# Patient Record
Sex: Male | Born: 1990 | Race: White | Hispanic: No | Marital: Single | Smoking: Current every day smoker
Health system: Southern US, Community
[De-identification: ages and names within clinical notes are randomized; demographics above are authoritative.]

## PROBLEM LIST (undated history)

## (undated) DIAGNOSIS — Z765 Malingerer [conscious simulation]: Secondary | ICD-10-CM

## (undated) DIAGNOSIS — N200 Calculus of kidney: Secondary | ICD-10-CM

## (undated) DIAGNOSIS — N179 Acute kidney failure, unspecified: Secondary | ICD-10-CM

## (undated) HISTORY — PX: FINGER SURGERY: SHX640

---

## 2014-05-26 ENCOUNTER — Emergency Department: Payer: Self-pay | Admitting: Emergency Medicine

## 2015-01-18 DIAGNOSIS — Z720 Tobacco use: Secondary | ICD-10-CM | POA: Diagnosis present

## 2015-01-18 DIAGNOSIS — D72829 Elevated white blood cell count, unspecified: Secondary | ICD-10-CM | POA: Insufficient documentation

## 2015-01-18 DIAGNOSIS — E860 Dehydration: Secondary | ICD-10-CM | POA: Insufficient documentation

## 2015-01-18 DIAGNOSIS — F1290 Cannabis use, unspecified, uncomplicated: Secondary | ICD-10-CM | POA: Insufficient documentation

## 2018-01-07 DIAGNOSIS — S62524D Nondisplaced fracture of distal phalanx of right thumb, subsequent encounter for fracture with routine healing: Secondary | ICD-10-CM | POA: Insufficient documentation

## 2018-01-15 DIAGNOSIS — L02511 Cutaneous abscess of right hand: Secondary | ICD-10-CM | POA: Insufficient documentation

## 2018-01-16 DIAGNOSIS — L03011 Cellulitis of right finger: Secondary | ICD-10-CM | POA: Insufficient documentation

## 2018-01-16 DIAGNOSIS — M6510 Other infective (teno)synovitis, unspecified site: Secondary | ICD-10-CM | POA: Insufficient documentation

## 2019-05-08 ENCOUNTER — Emergency Department (HOSPITAL_BASED_OUTPATIENT_CLINIC_OR_DEPARTMENT_OTHER): Admission: EM | Admit: 2019-05-08 | Discharge: 2019-05-08 | Discharge status: Against Medical Advice | Payer: Self-pay

## 2019-05-08 ENCOUNTER — Other Ambulatory Visit: Payer: Self-pay

## 2019-06-03 DIAGNOSIS — B159 Hepatitis A without hepatic coma: Secondary | ICD-10-CM

## 2019-06-03 HISTORY — DX: Hepatitis a without hepatic coma: B15.9

## 2020-09-03 DIAGNOSIS — Z765 Malingerer [conscious simulation]: Secondary | ICD-10-CM | POA: Insufficient documentation

## 2020-09-21 ENCOUNTER — Emergency Department (HOSPITAL_BASED_OUTPATIENT_CLINIC_OR_DEPARTMENT_OTHER)
Admission: EM | Admit: 2020-09-21 | Discharge: 2020-09-21 | Disposition: A | Discharge status: Home / Self Care | Payer: Self-pay | Attending: Emergency Medicine | Admitting: Emergency Medicine

## 2020-09-21 ENCOUNTER — Emergency Department (HOSPITAL_BASED_OUTPATIENT_CLINIC_OR_DEPARTMENT_OTHER): Payer: Self-pay

## 2020-09-21 ENCOUNTER — Encounter (HOSPITAL_BASED_OUTPATIENT_CLINIC_OR_DEPARTMENT_OTHER): Payer: Self-pay

## 2020-09-21 ENCOUNTER — Other Ambulatory Visit: Payer: Self-pay

## 2020-09-21 DIAGNOSIS — F17210 Nicotine dependence, cigarettes, uncomplicated: Secondary | ICD-10-CM | POA: Insufficient documentation

## 2020-09-21 DIAGNOSIS — R319 Hematuria, unspecified: Secondary | ICD-10-CM | POA: Insufficient documentation

## 2020-09-21 DIAGNOSIS — R109 Unspecified abdominal pain: Secondary | ICD-10-CM | POA: Insufficient documentation

## 2020-09-21 HISTORY — DX: Acute kidney failure, unspecified: N17.9

## 2020-09-21 HISTORY — DX: Calculus of kidney: N20.0

## 2020-09-21 LAB — URINALYSIS, ROUTINE W REFLEX MICROSCOPIC
Bilirubin Urine: NEGATIVE
Glucose, UA: NEGATIVE mg/dL
Ketones, ur: NEGATIVE mg/dL
Nitrite: NEGATIVE
Protein, ur: NEGATIVE mg/dL
Specific Gravity, Urine: 1.02 (ref 1.005–1.030)
pH: 6.5 (ref 5.0–8.0)

## 2020-09-21 LAB — URINALYSIS, MICROSCOPIC (REFLEX): RBC / HPF: >50 RBC/hpf (ref 0–5)

## 2020-09-21 MED ORDER — ACETAMINOPHEN 325 MG PO TABS
650.0000 mg | ORAL_TABLET | Freq: Once | ORAL | Status: AC
  Administered 2020-09-21: 650 mg via ORAL
  Filled 2020-09-21: qty 2

## 2020-09-21 MED ORDER — CIPROFLOXACIN HCL 500 MG PO TABS: 500.0000 mg | ORAL_TABLET | Freq: Two times a day (BID) | ORAL | 0 refills | Status: AC

## 2020-09-21 NOTE — Discharge Instructions (Addendum)
Follow up with Urology

## 2020-09-21 NOTE — ED Notes (Signed)
Pt urinated into strainer, small blood clots noted

## 2020-09-21 NOTE — ED Triage Notes (Addendum)
Pt c/o hematuria started yesterday-NAD-steady gait

## 2020-09-21 NOTE — ED Provider Notes (Signed)
[facility] EMERGENCY DEPARTMENT Provider Note   CSN: [id number] Arrival date & time: 09/21/20  1130     History Chief Complaint  Patient presents with  . Hematuria  . Abdominal Pain    Charles Fritz is a 30 y.o. male.  The history is provided by the patient. No language interpreter was used.  Hematuria This is a new problem. The current episode started 6 to 12 hours ago. The problem occurs constantly. The problem has been gradually worsening. Associated symptoms include abdominal pain. Nothing aggravates the symptoms. Nothing relieves the symptoms. He has tried nothing for the symptoms. The treatment provided no relief.  Abdominal Pain Associated symptoms: hematuria        Past Medical History:  Diagnosis Date  . Kidney failure, acute (HCC)   . Kidney stone     There are no problems to display for this patient.        No family history on file.  Social History   Tobacco Use  . Smoking status: Current Every Day Smoker    Types: Cigarettes  . Smokeless tobacco: Never Used  Substance Use Topics  . Alcohol use: Not Currently  . Drug use: Yes    Types: Marijuana    Home Medications Prior to Admission medications   Medication Sig Start Date End Date Taking? Authorizing Provider  ciprofloxacin (CIPRO) 500 MG tablet Take 1 tablet (500 mg total) by mouth 2 (two) times daily for 7 days. 09/21/20 09/28/20 Yes Elson Areas, PA-C    Allergies    Hydrocodone  Review of Systems   Review of Systems  Gastrointestinal: Positive for abdominal pain.  Genitourinary: Positive for hematuria.  All other systems reviewed and are negative.   Physical Exam Updated Vital Signs BP 130/90 (BP Location: Right Arm)   Pulse 74   Temp 98.3 F (36.8 C) (Oral)   Resp 16   Ht 5\' 11"  (1.803 m)   Wt 87.5 kg   SpO2 99%   BMI 26.92 kg/m   Physical Exam Vitals and nursing note reviewed.  Constitutional:      Appearance: He is well-developed.  HENT:      Head: Normocephalic and atraumatic.  Eyes:     Conjunctiva/sclera: Conjunctivae normal.  Cardiovascular:     Rate and Rhythm: Normal rate and regular rhythm.     Heart sounds: No murmur heard.   Pulmonary:     Effort: Pulmonary effort is normal. No respiratory distress.     Breath sounds: Normal breath sounds.  Abdominal:     General: Bowel sounds are normal.     Palpations: Abdomen is soft.     Tenderness: There is no abdominal tenderness.  Musculoskeletal:     Cervical back: Neck supple.  Skin:    General: Skin is warm and dry.  Neurological:     General: No focal deficit present.     Mental Status: He is alert.  Psychiatric:        Mood and Affect: Mood normal.     ED Results / Procedures / Treatments   Labs (all labs ordered are listed, but only abnormal results are displayed) Labs Reviewed  URINALYSIS, ROUTINE W REFLEX MICROSCOPIC - Abnormal; Notable for the following components:      Result Value   APPearance CLOUDY (*)    Hgb urine dipstick LARGE (*)    Leukocytes,Ua MODERATE (*)    All other components within normal limits  URINALYSIS, MICROSCOPIC (REFLEX) - Abnormal; Notable for the  following components:   Bacteria, UA MANY (*)    All other components within normal limits    EKG None  Radiology CT Renal Stone Study  Result Date: 09/21/2020 CLINICAL DATA:  Hematuria and flank/pelvic pain EXAM: CT ABDOMEN AND PELVIS WITHOUT CONTRAST TECHNIQUE: Multidetector CT imaging of the abdomen and pelvis was performed following the standard protocol without IV contrast. COMPARISON:  08/18/2020 CT scan FINDINGS: Lower chest: Unremarkable where included. Hepatobiliary: Unremarkable where included Pancreas: Unremarkable Spleen: Unremarkable Adrenals/Urinary Tract: Both adrenal glands appear normal. 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 No urinary tract calculi, hydronephrosis, or hydroureter. The urinary bladder appears normal. No substantial change from the  appearance from 5 weeks ago. Stomach/Bowel: Unremarkable.  Appendix normal. Vascular/Lymphatic: Unremarkable Reproductive: Unremarkable Other: No supplemental non-categorized findings. Musculoskeletal: Unremarkable IMPRESSION: 1. No significant abnormality is identified to account for the patient's symptoms. 2. 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 Electronically Signed   By: Gaylyn Rong M.D.   On: 09/21/2020 12:53    Procedures Procedures   Medications Ordered in ED Medications  acetaminophen (TYLENOL) tablet 650 mg (650 mg Oral Given 09/21/20 1309)    ED Course  I have reviewed the triage vital signs and the nursing notes.  Pertinent labs & imaging results that were available during my care of the patient were reviewed by me and considered in my medical decision making (see chart for details).    MDM Rules/Calculators/A&P                          MDM:  Ct scan is normal.  Ua shows may bacteria and greater than 50 rbc's.  Pt recently treat with keflex.  I will treat with cipro and refer  Final Clinical Impression(s) / ED Diagnoses Final diagnoses:  Hematuria, unspecified type    Rx / DC Orders ED Discharge Orders         Ordered    ciprofloxacin (CIPRO) 500 MG tablet  2 times daily        09/21/20 1322        An After Visit Summary was printed and given to the patient.    Elson Areas, [place] 09/21/20 1920    Little, Ambrose Finland, MD 09/23/20 [phone]

## 2021-01-10 DIAGNOSIS — N289 Disorder of kidney and ureter, unspecified: Secondary | ICD-10-CM | POA: Insufficient documentation

## 2021-01-24 ENCOUNTER — Emergency Department (HOSPITAL_COMMUNITY): Payer: Self-pay

## 2021-01-24 ENCOUNTER — Emergency Department (HOSPITAL_COMMUNITY)
Admission: EM | Admit: 2021-01-24 | Discharge: 2021-01-25 | Disposition: A | Discharge status: Home / Self Care | Payer: Self-pay | Attending: Physician Assistant | Admitting: Physician Assistant

## 2021-01-24 ENCOUNTER — Other Ambulatory Visit: Payer: Self-pay

## 2021-01-24 DIAGNOSIS — M791 Myalgia, unspecified site: Secondary | ICD-10-CM | POA: Insufficient documentation

## 2021-01-24 DIAGNOSIS — R0602 Shortness of breath: Secondary | ICD-10-CM | POA: Insufficient documentation

## 2021-01-24 DIAGNOSIS — R109 Unspecified abdominal pain: Secondary | ICD-10-CM | POA: Insufficient documentation

## 2021-01-24 DIAGNOSIS — R112 Nausea with vomiting, unspecified: Secondary | ICD-10-CM | POA: Insufficient documentation

## 2021-01-24 DIAGNOSIS — Z5321 Procedure and treatment not carried out due to patient leaving prior to being seen by health care provider: Secondary | ICD-10-CM | POA: Insufficient documentation

## 2021-01-24 LAB — CBC WITH DIFFERENTIAL/PLATELET
Abs Immature Granulocytes: 0.1 10*3/uL — ABNORMAL HIGH (ref 0.00–0.07)
Basophils Absolute: 0.1 10*3/uL (ref 0.0–0.1)
Basophils Relative: 1 %
Eosinophils Absolute: 0.3 10*3/uL (ref 0.0–0.5)
Eosinophils Relative: 2 %
HCT: 42.7 % (ref 39.0–52.0)
Hemoglobin: 14.3 g/dL (ref 13.0–17.0)
Immature Granulocytes: 1 %
Lymphocytes Relative: 23 %
Lymphs Abs: 3 10*3/uL (ref 0.7–4.0)
MCH: 31 pg (ref 26.0–34.0)
MCHC: 33.5 g/dL (ref 30.0–36.0)
MCV: 92.4 fL (ref 80.0–100.0)
Monocytes Absolute: 1.1 10*3/uL — ABNORMAL HIGH (ref 0.1–1.0)
Monocytes Relative: 9 %
Neutro Abs: 8.3 10*3/uL — ABNORMAL HIGH (ref 1.7–7.7)
Neutrophils Relative %: 64 %
Platelets: 245 10*3/uL (ref 150–400)
RBC: 4.62 MIL/uL (ref 4.22–5.81)
RDW: 12.7 % (ref 11.5–15.5)
WBC: 12.8 10*3/uL — ABNORMAL HIGH (ref 4.0–10.5)
nRBC: 0 % (ref 0.0–0.2)

## 2021-01-24 LAB — URINALYSIS, ROUTINE W REFLEX MICROSCOPIC
Bilirubin Urine: NEGATIVE
Glucose, UA: NEGATIVE mg/dL
Hgb urine dipstick: NEGATIVE
Ketones, ur: NEGATIVE mg/dL
Leukocytes,Ua: NEGATIVE
Nitrite: NEGATIVE
Protein, ur: NEGATIVE mg/dL
Specific Gravity, Urine: 1.026 (ref 1.005–1.030)
pH: 5 (ref 5.0–8.0)

## 2021-01-24 LAB — LIPASE, BLOOD: Lipase: 29 U/L (ref 11–51)

## 2021-01-24 LAB — COMPREHENSIVE METABOLIC PANEL
ALT: 29 U/L (ref 0–44)
AST: 26 U/L (ref 15–41)
Albumin: 4.6 g/dL (ref 3.5–5.0)
Alkaline Phosphatase: 75 U/L (ref 38–126)
Anion gap: 10 (ref 5–15)
BUN: 22 mg/dL — ABNORMAL HIGH (ref 6–20)
CO2: 22 mmol/L (ref 22–32)
Calcium: 9.7 mg/dL (ref 8.9–10.3)
Chloride: 105 mmol/L (ref 98–111)
Creatinine, Ser: 1.24 mg/dL (ref 0.61–1.24)
GFR, Estimated: >60 mL/min (ref >60)
Glucose, Bld: 99 mg/dL (ref 70–99)
Potassium: 4 mmol/L (ref 3.5–5.1)
Sodium: 137 mmol/L (ref 135–145)
Total Bilirubin: 0.8 mg/dL (ref 0.3–1.2)
Total Protein: 7.8 g/dL (ref 6.5–8.1)

## 2021-01-24 NOTE — ED Notes (Signed)
Pt did not want to wait said he was going to another hospital.

## 2021-01-24 NOTE — ED Triage Notes (Signed)
Pt c/o acute onset of abdominal cramping while at work today. Associated with NV, SOB, and generalized body cramping. Hospitalized in the past for acute renal failure, symptoms feel similar to previous episodes.

## 2021-01-24 NOTE — ED Provider Notes (Signed)
Emergency Medicine Provider Triage Evaluation Note  Charles Fritz , a 30 y.o. male  was evaluated in triage.  Pt complains of abd pain, nv, sob.  Review of Systems  Positive: Nv, abd pain, sob Negative: fever  Physical Exam  BP (!) 155/110 (BP Location: Left Arm)   Pulse (!) 108   Temp (!) 97.4 F (36.3 C) (Oral)   Resp (!) 24   SpO2 97%  Gen:   Awake, no distress   Resp:  Normal effort  MSK:   Moves extremities without difficulty   Medical Decision Making  Medically screening exam initiated at 10:03 PM.  Appropriate orders placed.  Charles Fritz was informed that the remainder of the evaluation will be completed by another provider, this initial triage assessment does not replace that evaluation, and the importance of remaining in the ED until their evaluation is complete.     Charles Fritz 01/24/21 2204    Charles Sprout, MD 01/24/21 2207

## 2021-04-22 ENCOUNTER — Emergency Department (HOSPITAL_BASED_OUTPATIENT_CLINIC_OR_DEPARTMENT_OTHER): Payer: Self-pay

## 2021-04-22 ENCOUNTER — Emergency Department (HOSPITAL_BASED_OUTPATIENT_CLINIC_OR_DEPARTMENT_OTHER)
Admission: EM | Admit: 2021-04-22 | Discharge: 2021-04-22 | Disposition: A | Discharge status: Home / Self Care | Payer: Self-pay | Attending: Emergency Medicine | Admitting: Emergency Medicine

## 2021-04-22 ENCOUNTER — Other Ambulatory Visit: Payer: Self-pay

## 2021-04-22 ENCOUNTER — Encounter (HOSPITAL_BASED_OUTPATIENT_CLINIC_OR_DEPARTMENT_OTHER): Payer: Self-pay

## 2021-04-22 DIAGNOSIS — R059 Cough, unspecified: Secondary | ICD-10-CM

## 2021-04-22 DIAGNOSIS — F1911 Other psychoactive substance abuse, in remission: Secondary | ICD-10-CM

## 2021-04-22 DIAGNOSIS — Z20822 Contact with and (suspected) exposure to covid-19: Secondary | ICD-10-CM | POA: Insufficient documentation

## 2021-04-22 DIAGNOSIS — J101 Influenza due to other identified influenza virus with other respiratory manifestations: Secondary | ICD-10-CM | POA: Insufficient documentation

## 2021-04-22 DIAGNOSIS — F17210 Nicotine dependence, cigarettes, uncomplicated: Secondary | ICD-10-CM | POA: Insufficient documentation

## 2021-04-22 HISTORY — DX: Other psychoactive substance abuse, in remission: F19.11

## 2021-04-22 LAB — RESP PANEL BY RT-PCR (FLU A&B, COVID) ARPGX2
Influenza A by PCR: POSITIVE — AB
Influenza B by PCR: NEGATIVE
SARS Coronavirus 2 by RT PCR: NEGATIVE

## 2021-04-22 LAB — D-DIMER, QUANTITATIVE: D-Dimer, Quant: <0.27 ug/mL-FEU (ref 0.00–0.50)

## 2021-04-22 MED ORDER — IPRATROPIUM-ALBUTEROL 0.5-2.5 (3) MG/3ML IN SOLN
3.0000 mL | Freq: Once | RESPIRATORY_TRACT | Status: AC
  Administered 2021-04-22: 3 mL via RESPIRATORY_TRACT
  Filled 2021-04-22: qty 3

## 2021-04-22 MED ORDER — DIPHENHYDRAMINE HCL 50 MG/ML IJ SOLN
50.0000 mg | Freq: Once | INTRAMUSCULAR | Status: AC
  Administered 2021-04-22: 50 mg via INTRAVENOUS
  Filled 2021-04-22: qty 1

## 2021-04-22 MED ORDER — METOCLOPRAMIDE HCL 5 MG/ML IJ SOLN
10.0000 mg | Freq: Once | INTRAMUSCULAR | Status: AC
  Administered 2021-04-22: 10 mg via INTRAVENOUS
  Filled 2021-04-22: qty 2

## 2021-04-22 MED ORDER — BENZONATATE 100 MG PO CAPS: 100.0000 mg | ORAL_CAPSULE | Freq: Three times a day (TID) | ORAL | 0 refills | Status: DC | PRN

## 2021-04-22 MED ORDER — ALBUTEROL SULFATE (2.5 MG/3ML) 0.083% IN NEBU
2.5000 mg | INHALATION_SOLUTION | Freq: Once | RESPIRATORY_TRACT | Status: AC
  Administered 2021-04-22: 2.5 mg via RESPIRATORY_TRACT
  Filled 2021-04-22: qty 3

## 2021-04-22 NOTE — ED Provider Notes (Signed)
[facility] EMERGENCY DEPARTMENT Provider Note   CSN: [id number] Arrival date & time: 04/22/21  0941     History Chief Complaint  Patient presents with   Cough    Charles Fritz is a 30 y.o. 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  Patient presents emergency department with a chief complaint of cough, headache, and chest congestion.  Patient states that his symptoms have been present over the last 2 weeks however worse over the last 3 days.  Patient states that his headache has been constant over the last 3 days.  Headache onset was gradual pain progressively worse over time.  At present patient rates pain 8/10 on the pain scale.  Pain is located to frontotemporal aspect of head.  Patient reports that pain is worse with cough.  Patient has not tried any modalities to alleviate his symptoms.  Denies any visual disturbance, numbness, weakness, facial asymmetry, dysarthria, neck pain, neck stiffness.  Patient reports that cough is producing clear mucus.  States that he has had shortness of breath over the last 2 days.  Denies any hemoptysis, chest pain, leg swelling, palpitations.  Patient denies any known sick contacts.  Patient denies vaccination for influenza or COVID-19.  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  Marijuana occasionally   Kidney failure, acute (HCC)    Kidney stone     There are no problems to display for this patient.   Past Surgical History:  Procedure Laterality Date   FINGER SURGERY         Family History   Problem Relation Age of Onset   Healthy Mother    Healthy Father    Healthy Sister    Healthy Brother     Social History   Tobacco Use   Smoking status: Every Day    Packs/day: 0.50    Years: 15.00    Pack years: 7.50    Types: Cigarettes   Smokeless tobacco: Never  Substance Use Topics   Alcohol use: Not Currently   Drug use: Yes    Frequency: 3.0 times per week    Types: Marijuana    Home Medications Prior to Admission medications   Not on File    Allergies    Hydrocodone  Review of Systems   Review of Systems  Constitutional:  Negative for chills and fever.  HENT:  Negative for congestion, rhinorrhea and sore throat.   Eyes:  Negative for visual disturbance.  Respiratory:  Positive for cough and shortness of breath.   Cardiovascular:  Negative for chest pain, palpitations and leg swelling.  Gastrointestinal:  Negative for abdominal pain, constipation, diarrhea, nausea and vomiting.  Genitourinary:  Negative for difficulty urinating and dysuria.  Musculoskeletal:  Negative for back pain, neck pain and neck stiffness.  Skin:  Negative for color change and rash.  Neurological:  Positive for headaches. Negative for dizziness, tremors, seizures, syncope, facial asymmetry, speech difficulty, weakness, light-headedness and numbness.  Psychiatric/Behavioral:  Negative for confusion.    Physical Exam  Updated Vital Signs BP (!) 145/105 (BP Location: Right Arm)   Pulse 85   Temp 98.1 F (36.7 C) (Oral)   Resp 17   Ht 5\' 11"  (1.803 m)   Wt 104.3 kg   SpO2 99%   BMI 32.08 kg/m   Physical Exam Vitals and nursing note reviewed.  Constitutional:      General: He is not in acute distress.    Appearance: He is ill-appearing. He is not toxic-appearing or diaphoretic.  HENT:     Head: Normocephalic.  Eyes:     General: No scleral icterus.       Right eye: No discharge.        Left eye: No discharge.     Extraocular Movements: Extraocular movements intact.      Conjunctiva/sclera: Conjunctivae normal.     Pupils: Pupils are equal, round, and reactive to light.  Cardiovascular:     Rate and Rhythm: Normal rate.  Pulmonary:     Effort: Pulmonary effort is normal. No tachypnea, bradypnea or respiratory distress.     Breath sounds: Normal breath sounds. No stridor.  Musculoskeletal:     Cervical back: Normal range of motion and neck supple. No edema, erythema, signs of trauma, rigidity, torticollis or crepitus. No pain with movement, spinous process tenderness or muscular tenderness. Normal range of motion.     Right lower leg: Normal.     Left lower leg: Normal.  Skin:    General: Skin is warm and dry.  Neurological:     General: No focal deficit present.     Mental Status: He is alert.     GCS: GCS eye subscore is 4. GCS verbal subscore is 5. GCS motor subscore is 6.     Cranial Nerves: Cranial nerves 2-12 are intact. No cranial nerve deficit, dysarthria or facial asymmetry.     Sensory: Sensation is intact.     Motor: No weakness, tremor, seizure activity or pronator drift.     Coordination: Finger-Nose-Finger Test normal.     Gait: Gait is intact. Gait normal.     Comments:  CN II through XII intact, +5 strength to bilateral upper and lower extremities, sensation to light touch intact to bilateral upper and lower extremities.  Patient able to stand and ambulate without difficulty.  Psychiatric:        Behavior: Behavior is cooperative.    ED Results / Procedures / Treatments   Labs (all labs ordered are listed, but only abnormal results are displayed) Labs Reviewed  RESP PANEL BY RT-PCR (FLU A&B, COVID) ARPGX2 - Abnormal; Notable for the following components:      Result Value   Influenza A by PCR POSITIVE (*)    All other components within normal limits  D-DIMER, QUANTITATIVE    EKG None  Radiology DG Chest Port 1 View  Result Date: 04/22/2021 CLINICAL DATA:  Cough with head and chest congestion for 2 weeks EXAM: PORTABLE  CHEST 1 VIEW COMPARISON:  01/24/2021 FINDINGS: Normal heart size and mediastinal contours. No acute infiltrate or edema. No effusion or pneumothorax. No acute osseous findings. Remote left mid clavicle fracture with hypertrophic healing. IMPRESSION: No active disease. Electronically Signed   By: 03/26/2021 M.D.   On: 04/22/2021 11:18    Procedures Procedures   Medications Ordered in ED Medications  metoCLOPramide (REGLAN) injection 10 mg (has no administration in time range)  diphenhydrAMINE (BENADRYL) injection 50 mg (has no administration in time range)  albuterol (PROVENTIL) (2.5 MG/3ML) 0.083%  nebulizer solution 2.5 mg (2.5 mg Nebulization Given 04/22/21 1030)  ipratropium-albuterol (DUONEB) 0.5-2.5 (3) MG/3ML nebulizer solution 3 mL (3 mLs Nebulization Given 04/22/21 1030)    ED Course  I have reviewed the triage vital signs and the nursing notes.  Pertinent labs & imaging results that were available during my care of the patient were reviewed by me and considered in my medical decision making (see chart for details).    MDM Rules/Calculators/A&P                           Alert 30 year old male no acute distress, nontoxic-appearing.  Presents to ED with chief complaint of headache, cough, and shortness of breath.  Headache onset was gradual pain progressively worse over time.  No aggravation of pain with exertion.  Neuro exam is reassuring.  No recent head injury.  Low suspicion for subarachnoid hemorrhage at this time.  We will give patient migraine cocktail and reassess.  Lungs clear to auscultation bilaterally.  Patient able to speak in full complete sentences without difficulty.  Will obtain respiratory panel to evaluate for influenza and COVID; chest x-ray to evaluate for pneumonia; and D-dimer to evaluate for possible PE due to patient's prolonged sitting in cars.  Patient is low risk based on Wells criteria and if dimer is in normal limits low suspicion for PE.  D-dimer  within normal limits, low suspicion for PE at this time. Chest x-ray shows no active cardiopulmonary disease. EKG shows normal sinus rhythm. Respiratory panel positive for influenza A.  Patient reports resolution of headache after receiving migraine cocktail.  Patient hemodynamically stable.  Will discharge patient with Tessalon.  Patient given information on symptomatic treatment.  Patient to follow-up with PCP as needed if symptoms do not improve.  Discussed results, findings, treatment and follow up. Patient advised of return precautions. Patient verbalized understanding and agreed with plan.   Final Clinical Impression(s) / ED Diagnoses Final diagnoses:  Influenza A    Rx / DC Orders ED Discharge Orders          Ordered    benzonatate (TESSALON) 100 MG capsule  3 times daily PRN        04/22/21 1250             Haskel Schroeder, PA-C 04/22/21 1253    Gloris Manchester, MD 04/23/21 [phone]

## 2021-04-22 NOTE — Discharge Instructions (Signed)
With the flu your are considered infectious until you are fever free for 24 hours.  You can alternate Tylenol/acetaminophen and Advil/ibuprofen/Motrin every 4 hours for sore throat, body aches, headache or fever.  Do not take more than 3,000 mg tylenol in a 24 hour period.  Please check all medication labels as many medications such as pain and cold medications may contain tylenol.  Do not drink alcohol while taking these medications.  Do not take other NSAID'S while taking ibuprofen (such as aleve or naproxen).  Please take ibuprofen with food to decrease stomach upset.  Please make sure to stay well hydrated.  Drink plenty of water or watered down sports drinks.  If drinking sports drinks please stay away from red colored drinks; as they can cause confusion for bleeding if you vomit.   Please make sure to practice good hand hygiene to help prevent the spread of flu.  You may use saline nasal spray for congestion.  Can use over-the-counter cough medication to help with your cough.  Follow up with your primary care provider if symptoms persist.  Return to the ER for inability to swallow liquids, difficulty breathing, or new or worsening symptoms.

## 2021-04-22 NOTE — ED Triage Notes (Signed)
Cough, head & chest congestion,  x 2 weeks

## 2021-10-03 ENCOUNTER — Encounter (HOSPITAL_BASED_OUTPATIENT_CLINIC_OR_DEPARTMENT_OTHER): Payer: Self-pay

## 2021-10-03 ENCOUNTER — Other Ambulatory Visit: Payer: Self-pay

## 2021-10-03 ENCOUNTER — Emergency Department (HOSPITAL_BASED_OUTPATIENT_CLINIC_OR_DEPARTMENT_OTHER)
Admission: EM | Admit: 2021-10-03 | Discharge: 2021-10-03 | Disposition: A | Discharge status: Home / Self Care | Payer: Self-pay | Attending: Emergency Medicine | Admitting: Emergency Medicine

## 2021-10-03 ENCOUNTER — Emergency Department (HOSPITAL_BASED_OUTPATIENT_CLINIC_OR_DEPARTMENT_OTHER): Payer: Self-pay

## 2021-10-03 DIAGNOSIS — R778 Other specified abnormalities of plasma proteins: Secondary | ICD-10-CM | POA: Insufficient documentation

## 2021-10-03 DIAGNOSIS — Z87891 Personal history of nicotine dependence: Secondary | ICD-10-CM | POA: Insufficient documentation

## 2021-10-03 DIAGNOSIS — D72829 Elevated white blood cell count, unspecified: Secondary | ICD-10-CM | POA: Insufficient documentation

## 2021-10-03 DIAGNOSIS — M25512 Pain in left shoulder: Secondary | ICD-10-CM | POA: Insufficient documentation

## 2021-10-03 DIAGNOSIS — R55 Syncope and collapse: Secondary | ICD-10-CM | POA: Insufficient documentation

## 2021-10-03 DIAGNOSIS — R231 Pallor: Secondary | ICD-10-CM | POA: Insufficient documentation

## 2021-10-03 DIAGNOSIS — Y990 Civilian activity done for income or pay: Secondary | ICD-10-CM | POA: Insufficient documentation

## 2021-10-03 DIAGNOSIS — R42 Dizziness and giddiness: Secondary | ICD-10-CM | POA: Insufficient documentation

## 2021-10-03 DIAGNOSIS — W1839XA Other fall on same level, initial encounter: Secondary | ICD-10-CM | POA: Insufficient documentation

## 2021-10-03 LAB — BASIC METABOLIC PANEL
Anion gap: 8 (ref 5–15)
BUN: 20 mg/dL (ref 6–20)
CO2: 21 mmol/L — ABNORMAL LOW (ref 22–32)
Calcium: 8.8 mg/dL — ABNORMAL LOW (ref 8.9–10.3)
Chloride: 110 mmol/L (ref 98–111)
Creatinine, Ser: 0.9 mg/dL (ref 0.61–1.24)
GFR, Estimated: >60 mL/min (ref >60)
Glucose, Bld: 119 mg/dL — ABNORMAL HIGH (ref 70–99)
Potassium: 3.7 mmol/L (ref 3.5–5.1)
Sodium: 139 mmol/L (ref 135–145)

## 2021-10-03 LAB — CBC
HCT: 39.3 % (ref 39.0–52.0)
Hemoglobin: 13.5 g/dL (ref 13.0–17.0)
MCH: 31.7 pg (ref 26.0–34.0)
MCHC: 34.4 g/dL (ref 30.0–36.0)
MCV: 92.3 fL (ref 80.0–100.0)
Platelets: 208 10*3/uL (ref 150–400)
RBC: 4.26 MIL/uL (ref 4.22–5.81)
RDW: 12.9 % (ref 11.5–15.5)
WBC: 11.5 10*3/uL — ABNORMAL HIGH (ref 4.0–10.5)
nRBC: 0 % (ref 0.0–0.2)

## 2021-10-03 LAB — URINALYSIS, ROUTINE W REFLEX MICROSCOPIC
Bilirubin Urine: NEGATIVE
Glucose, UA: NEGATIVE mg/dL
Hgb urine dipstick: NEGATIVE
Ketones, ur: NEGATIVE mg/dL
Leukocytes,Ua: NEGATIVE
Nitrite: NEGATIVE
Protein, ur: NEGATIVE mg/dL
Specific Gravity, Urine: >=1.03 (ref 1.005–1.030)
pH: 5.5 (ref 5.0–8.0)

## 2021-10-03 LAB — D-DIMER, QUANTITATIVE: D-Dimer, Quant: 0.27 ug/mL-FEU (ref 0.00–0.50)

## 2021-10-03 LAB — TROPONIN I (HIGH SENSITIVITY): Troponin I (High Sensitivity): 3 ng/L (ref <18)

## 2021-10-03 LAB — CK: Total CK: 226 U/L (ref 49–397)

## 2021-10-03 LAB — CBG MONITORING, ED: Glucose-Capillary: 122 mg/dL — ABNORMAL HIGH (ref 70–99)

## 2021-10-03 MED ORDER — IBUPROFEN 400 MG PO TABS
600.0000 mg | ORAL_TABLET | Freq: Once | ORAL | Status: AC
  Administered 2021-10-03: 600 mg via ORAL
  Filled 2021-10-03: qty 1

## 2021-10-03 MED ORDER — SODIUM CHLORIDE 0.9 % IV BOLUS
1000.0000 mL | Freq: Once | INTRAVENOUS | Status: AC
  Administered 2021-10-03: 1000 mL via INTRAVENOUS

## 2021-10-03 NOTE — ED Triage Notes (Signed)
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 C/o right shoulder/elbow pain, fatigue, lightheaded.  ?

## 2021-10-03 NOTE — ED Notes (Signed)
Patient ambulated in the hall, from his room to the end of the nursing stations Patient did not experience any pain, shortness of breath, headache or dizziness. Patient is now back in bed and complains of dizziness while laying in bed and some shortness of breath.  ?

## 2021-10-03 NOTE — Discharge Instructions (Signed)
Your work-up today was reassuring, please follow-up with cardiology.  Information provided above, call to schedule appointment.  Drink plenty of fluids, take Tylenol Motrin for pain.  X-ray did not show any fractures.  If you have another syncopal event you need to return back to the ED for evaluation. ?

## 2021-10-03 NOTE — ED Provider Notes (Signed)
?[facility] EMERGENCY DEPARTMENT ?Provider Note ? ? ?CSN: [id number] ?Arrival date & time: 10/03/21  1754 ? ?  ? ?History ? ?Chief Complaint  ?Patient presents with  ? Loss of Consciousness  ? ? ?[name] is a 31 y.o. male. ? ? ?Loss of Consciousness ? ?Patient with medical history notable for former cigarette dependence, history of substance use disorder presents today due to syncopal event/fall.  Patient states yesterday he was working on his truck, he started feeling lightheaded and saw spots, he lost consciousness and fell on his left shoulder.  Denies any prodromal chest pain or feeling short of breath at that time.  He went back to work throughout the day, today he was having pain in his shoulder so he presented at the ED.  He is still having the spots intermittently, having dizziness but denies any chest pain.  Patient does not have any history of seizures although his twin brother has seizures.  Patient denies any alcohol or drug use.  No new changes in medicine, does not take any medicine daily. ? ?Home Medications ?Prior to Admission medications   ?Medication Sig Start Date End Date Taking? Authorizing Provider  ?benzonatate (TESSALON) 100 MG capsule Take 1 capsule (100 mg total) by mouth 3 (three) times daily as needed for cough. 04/22/21   Haskel Schroeder, PA-C  ?   ? ?Allergies    ?Hydrocodone   ? ?Review of Systems   ?Review of Systems  ?Cardiovascular:  Positive for syncope.  ? ?Physical Exam ?Updated Vital Signs ?BP (!) 161/87 (BP Location: Left Arm)   Pulse 64   Temp 98.4 ?F (36.9 ?C) (Oral)   Resp (!) 27   Ht 5\' 11"  (1.803 m)   Wt 115.7 kg   SpO2 100%   BMI 35.57 kg/m?  ?Physical Exam ?Vitals and nursing note reviewed. Exam conducted with a chaperone present.  ?Constitutional:   ?   Appearance: Normal appearance.  ?HENT:  ?   Head: Normocephalic and atraumatic.  ?Eyes:  ?   General: No scleral icterus.    ?   Right eye: No discharge.     ?   Left eye: No discharge.   ?   Extraocular Movements: Extraocular movements intact.  ?   Pupils: Pupils are equal, round, and reactive to light.  ?Cardiovascular:  ?   Rate and Rhythm: Normal rate and regular rhythm.  ?   Pulses: Normal pulses.  ?   Heart sounds: Normal heart sounds. No murmur heard. ?  No friction rub. No gallop.  ?Pulmonary:  ?   Effort: Pulmonary effort is normal. No respiratory distress.  ?   Breath sounds: Normal breath sounds.  ?   Comments: Tachypneic but breath sounds bilat ?Abdominal:  ?   General: Abdomen is flat. Bowel sounds are normal. There is no distension.  ?   Palpations: Abdomen is soft.  ?   Tenderness: There is no abdominal tenderness.  ?Musculoskeletal:  ?   Cervical back: Normal range of motion.  ?Skin: ?   General: Skin is warm and dry.  ?   Coloration: Skin is pale. Skin is not jaundiced.  ?Neurological:  ?   Mental Status: He is alert. Mental status is at baseline.  ?   Coordination: Coordination normal.  ?   Comments: Cranial nerves III through XII are grossly intact  ? ? ?ED Results / Procedures / Treatments   ?Labs ?(all labs ordered are listed, but only abnormal results are  displayed) ?Labs Reviewed  ?BASIC METABOLIC PANEL - Abnormal; Notable for the following components:  ?    Result Value  ? CO2 21 (*)   ? Glucose, Bld 119 (*)   ? Calcium 8.8 (*)   ? All other components within normal limits  ?CBC - Abnormal; Notable for the following components:  ? WBC 11.5 (*)   ? All other components within normal limits  ?CBG MONITORING, ED - Abnormal; Notable for the following components:  ? Glucose-Capillary 122 (*)   ? All other components within normal limits  ?URINALYSIS, ROUTINE W REFLEX MICROSCOPIC  ?CK  ?D-DIMER, QUANTITATIVE  ?TROPONIN I (HIGH SENSITIVITY)  ? ? ?EKG ?EKG Interpretation ? ?Date/Time:  Tuesday October 03 2021 18:05:23 EDT ?Ventricular Rate:  86 ?PR Interval:  160 ?QRS Duration: 100 ?QT Interval:  378 ?QTC Calculation: 452 ?R Axis:   64 ?Text Interpretation: Normal sinus rhythm with  sinus arrhythmia Abnormal ECG When compared with ECG of 22-Apr-2021 09:54, PREVIOUS ECG IS PRESENT similar to prior tracing Confirmed by Tanda Rockers ([id number]) on 10/03/2021 6:46:36 PM ? ?Radiology ?DG Chest 2 View ? ?Result Date: 10/03/2021 ?CLINICAL DATA:  Short of breath.  Fall yesterday EXAM: CHEST - 2 VIEW COMPARISON:  08/22/2021 FINDINGS: Heart size upper normal. Normal vascularity. Lungs clear without infiltrate or effusion Chronic fracture left clavicle unchanged. IMPRESSION: No active cardiopulmonary disease. Electronically Signed   By: Marlan Palau M.D.   On: 10/03/2021 18:38  ? ?DG Ribs Unilateral Right ? ?Result Date: 10/03/2021 ?CLINICAL DATA:  Larey Seat off a truck yesterday. EXAM: RIGHT RIBS - 2 VIEW COMPARISON:  Chest two views 10/03/2021, AP chest 08/22/2021 FINDINGS: A marker is seen lateral to the right tenth rib. No acute fracture is visualized with attention to this location. No pneumothorax. IMPRESSION: No visible right rib fracture. Electronically Signed   By: Neita Garnet M.D.   On: 10/03/2021 19:05  ? ?DG Shoulder Right ? ?Result Date: 10/03/2021 ?CLINICAL DATA:  Fall.  Shoulder pain.  Larey Seat off a truck yesterday. EXAM: RIGHT SHOULDER - 2+ VIEW COMPARISON:  None. FINDINGS: Normal bone mineralization. The glenohumeral and acromioclavicular joints are appropriately aligned. Mild acromioclavicular joint space narrowing. No acute fracture or dislocation. IMPRESSION: No acute fracture. Electronically Signed   By: Neita Garnet M.D.   On: 10/03/2021 19:04  ? ?CT Head Wo Contrast ? ?Result Date: 10/03/2021 ?CLINICAL DATA:  Head trauma, moderate-severe; Neck trauma, midline tenderness (Age 97-64y). Head and Neck trauma, midline tenderness --LOC EXAM: CT HEAD WITHOUT CONTRAST CT CERVICAL SPINE WITHOUT CONTRAST TECHNIQUE: Multidetector CT imaging of the head and cervical spine was performed following the standard protocol without intravenous contrast. Multiplanar CT image reconstructions of the cervical spine  were also generated. RADIATION DOSE REDUCTION: This exam was performed according to the departmental dose-optimization program which includes automated exposure control, adjustment of the mA and/or kV according to patient size and/or use of iterative reconstruction technique. COMPARISON:  CT head cervical spine 12/14/2007: FINDINGS: CT HEAD FINDINGS Brain: No evidence of large-territorial acute infarction. No parenchymal hemorrhage. No mass lesion. No extra-axial collection. No mass effect or midline shift. No hydrocephalus. Basilar cisterns are patent. Vascular: No hyperdense vessel. Skull: No acute fracture or focal lesion. Sinuses/Orbits: Almost complete opacification of bilateral sphenoid sinuses. Mucosal thickening of the ethmoid sinuses. Mucosal thickening of the maxillary sinuses. Otherwise remaining visualized paranasal sinuses and mastoid air cells are clear. The orbits are unremarkable. Other: None. CT CERVICAL SPINE FINDINGS Alignment: Reversal of the normal cervical lordosis centered at  the C5 level likely due to positionin. Skull base and vertebrae: No acute fracture. No aggressive appearing focal osseous lesion or focal pathologic process. Soft tissues and spinal canal: No prevertebral fluid or swelling. No visible canal hematoma. Upper chest: Unremarkable. Other: Left shoulder degenerative changes versus old healed fracture. IMPRESSION: 1. No acute intracranial abnormality. 2. No acute displaced fracture or traumatic listhesis of the cervical spine. 3. Noted come ethmoid, maxillary mucosal thickening. Electronically Signed   By: Tish FredericksonMorgane  Naveau M.D.   On: 10/03/2021 19:19  ? ?CT Cervical Spine Wo Contrast ? ?Result Date: 10/03/2021 ?CLINICAL DATA:  Head trauma, moderate-severe; Neck trauma, midline tenderness (Age 36-64y). Head and Neck trauma, midline tenderness --LOC EXAM: CT HEAD WITHOUT CONTRAST CT CERVICAL SPINE WITHOUT CONTRAST TECHNIQUE: Multidetector CT imaging of the head and cervical spine  was performed following the standard protocol without intravenous contrast. Multiplanar CT image reconstructions of the cervical spine were also generated. RADIATION DOSE REDUCTION: This exam was performed according

## 2021-11-26 ENCOUNTER — Other Ambulatory Visit: Payer: Self-pay

## 2021-11-26 ENCOUNTER — Emergency Department (HOSPITAL_BASED_OUTPATIENT_CLINIC_OR_DEPARTMENT_OTHER)
Admission: EM | Admit: 2021-11-26 | Discharge: 2021-11-26 | Disposition: A | Discharge status: Home / Self Care | Payer: Self-pay | Attending: Emergency Medicine | Admitting: Emergency Medicine

## 2021-11-26 DIAGNOSIS — S80812A Abrasion, left lower leg, initial encounter: Secondary | ICD-10-CM | POA: Insufficient documentation

## 2021-11-26 DIAGNOSIS — S8992XA Unspecified injury of left lower leg, initial encounter: Secondary | ICD-10-CM

## 2021-11-26 DIAGNOSIS — M7989 Other specified soft tissue disorders: Secondary | ICD-10-CM | POA: Insufficient documentation

## 2021-11-26 DIAGNOSIS — Y990 Civilian activity done for income or pay: Secondary | ICD-10-CM | POA: Insufficient documentation

## 2021-11-26 DIAGNOSIS — W208XXA Other cause of strike by thrown, projected or falling object, initial encounter: Secondary | ICD-10-CM | POA: Insufficient documentation

## 2021-11-26 MED ORDER — BACITRACIN ZINC 500 UNIT/GM EX OINT: 1.0000 "application " | TOPICAL_OINTMENT | Freq: Two times a day (BID) | CUTANEOUS | 0 refills | Status: DC

## 2021-11-26 MED ORDER — OXYCODONE HCL 5 MG PO TABS: 5.0000 mg | ORAL_TABLET | ORAL | 0 refills | Status: DC | PRN

## 2021-11-26 MED ORDER — METHOCARBAMOL 500 MG PO TABS: 500.0000 mg | ORAL_TABLET | Freq: Two times a day (BID) | ORAL | 0 refills | Status: DC

## 2021-11-26 NOTE — ED Triage Notes (Signed)
Pt arrived at this facility at 1748 but was initially registered under the wrong name.

## 2021-11-26 NOTE — ED Provider Notes (Signed)
[facility] EMERGENCY DEPARTMENT Provider Note   CSN: [id number] Arrival date & time: 11/26/21  1940    History  Leg injury  Charles Fritz is a 31 y.o. male for evaluation of leg injury.  Patient was carrying a log at work, subsequently stepped on a piece of wood which was run subsequently falling in between 2 other sheets of wood.  He sustained abrasion to anterior tib-fib.  No numbness, weakness, has some surrounding redness without warmth.  Unknown last tetanus.  Ambulatory PTA  HPI     Home Medications Prior to Admission medications   Medication Sig Start Date End Date Taking? Authorizing Provider  bacitracin ointment Apply 1 application. topically 2 (two) times daily. 11/26/21  Yes Madelene Kaatz A, PA-C  methocarbamol (ROBAXIN) 500 MG tablet Take 1 tablet (500 mg total) by mouth 2 (two) times daily. 11/26/21  Yes Kiylee Thoreson A, PA-C  oxyCODONE (ROXICODONE) 5 MG immediate release tablet Take 1 tablet (5 mg total) by mouth every 4 (four) hours as needed for severe pain. 11/26/21  Yes Lendora Keys A, PA-C  benzonatate (TESSALON) 100 MG capsule Take 1 capsule (100 mg total) by mouth 3 (three) times daily as needed for cough. 04/22/21   Haskel Schroeder, PA-C      Allergies    Hydrocodone    Review of Systems   Review of Systems  Constitutional: Negative.   HENT: Negative.    Respiratory: Negative.    Cardiovascular: Negative.   Gastrointestinal: Negative.   Genitourinary: Negative.   Musculoskeletal:        Pain in the left anterior tib-fib  Skin:        Abrasion left tib-fib  Neurological: Negative.   All other systems reviewed and are negative.  Physical Exam Updated Vital Signs BP (!) 157/110   Pulse 93   Temp 97.8 F (36.6 C) (Oral)   Resp 18   SpO2 97%  Physical Exam Vitals and nursing note reviewed.  Constitutional:      General: He is not in acute distress.    Appearance: He is well-developed. He is not ill-appearing,  toxic-appearing or diaphoretic.  HENT:     Head: Atraumatic.  Eyes:     Pupils: Pupils are equal, round, and reactive to light.  Cardiovascular:     Rate and Rhythm: Normal rate and regular rhythm.     Pulses: Normal pulses.          Radial pulses are 2+ on the right side and 2+ on the left side.       Popliteal pulses are 2+ on the left side.       Posterior tibial pulses are 2+ on the left side.     Heart sounds: Normal heart sounds.  Pulmonary:     Effort: Pulmonary effort is normal. No respiratory distress.  Abdominal:     General: There is no distension.     Palpations: Abdomen is soft.  Musculoskeletal:        General: Normal range of motion.     Cervical back: Normal range of motion and neck supple.     Comments: Stable, nontender palpation.  Full range of motion bilateral lower extremities without difficulty.  Able to straight leg raise.  Diffuse tenderness left anterior tib-fib.  Compartments soft.  Nontender posterior calves.  Able to plantarflex dorsiflex the bilateral lower extremities, wiggles toes without difficulty  Skin:    General: Skin is warm and dry.     Capillary  Refill: Capillary refill takes less than 2 seconds.     Comments: 10 cm abrasion left anterior tib-fib, no lacerations to suture.  Mild surrounding erythema, no warmth  Neurological:     General: No focal deficit present.     Mental Status: He is alert and oriented to person, place, and time.     Sensory: Sensation is intact.     Motor: Motor function is intact.     Gait: Gait is intact.     Comments: intact sensation Equal strength Ambulatory without ataxic    ED Results / Procedures / Treatments   Labs (all labs ordered are listed, but only abnormal results are displayed) Labs Reviewed - No data to display  EKG None  Radiology No results found.  Procedures .Ortho Injury Treatment  Date/Time: 11/26/2021 8:11 PM Performed by: Ralph LeydenHenderly, Gola Bribiesca A, PA-C Authorized by: Linwood DibblesHenderly, Kianah Harries A,  PA-C   Consent:    Consent obtained:  Verbal   Consent given by:  Patient   Risks discussed:  Fracture, nerve damage, restricted joint movement, vascular damage, stiffness, recurrent dislocation and irreducible dislocation   Alternatives discussed:  No treatment, alternative treatment, immobilization, referral and delayed treatmentInjury location: lower leg Location details: left lower leg Injury type: soft tissue Pre-procedure neurovascular assessment: neurovascularly intact Pre-procedure distal perfusion: normal Pre-procedure neurological function: normal Pre-procedure range of motion: normal  Anesthesia: Local anesthesia used: no  Patient sedated: NoImmobilization: crutches and brace Splint Applied by: ED Tech Post-procedure neurovascular assessment: post-procedure neurovascularly intact Post-procedure distal perfusion: normal Post-procedure neurological function: normal Post-procedure range of motion: normal      Medications Ordered in ED Medications - No data to display  ED Course/ Medical Decision Making/ A&P    31 year old here for evaluation of mechanical fall.  Carrying piece of wood earlier today subsequently stepping on rotten piece of wood falling through 2 pieces of wood suffering abrasion to the left anterior tib-fib.  He has some anterior bony tenderness.  He has full range of motion.  He is neurovascularly intact.  His compartments are soft.  I have lows suspicion for compartment syndrome.  He has nontender posterior calves.  Low suspicion for VTE, ischemia.  His tetanus was updated here.  Wound occurred just PTA have low suspicion for infectious process.  Unfortunately patient was registered as his twin brother in the computer.  Imaging was done on this patient however under family member's name.    Personally viewed and interpreted his left tib-fib x-ray: No acute fracture, dislocation  Patient given crutches for support, wound was irrigated, cleaned,  bacitracin applied.  Suspect soft tissue injury.  Will DC home with symptomatic management, return for new or worsening symptoms.  Patient agreeable.  The patient has been appropriately medically screened and/or stabilized in the ED. I have low suspicion for any other emergent medical condition which would require further screening, evaluation or treatment in the ED or require inpatient management.  Patient is hemodynamically stable and in no acute distress.  Patient able to ambulate in department prior to ED.  Evaluation does not show acute pathology that would require ongoing or additional emergent interventions while in the emergency department or further inpatient treatment.  I have discussed the diagnosis with the patient and answered all questions.  Pain is been managed while in the emergency department and patient has no further complaints prior to discharge.  Patient is comfortable with plan discussed in room and is stable for discharge at this time.  I have discussed strict  return precautions for returning to the emergency department.  Patient was encouraged to follow-up with PCP/specialist refer to at discharge.                            Medical Decision Making Amount and/or Complexity of Data Reviewed Radiology: ordered and independent interpretation performed. Decision-making details documented in ED Course.  Risk OTC drugs. Prescription drug management. Parenteral controlled substances. Diagnosis or treatment significantly limited by social determinants of health.           Final Clinical Impression(s) / ED Diagnoses Final diagnoses:  Injury of left lower extremity, initial encounter    Rx / DC Orders ED Discharge Orders          Ordered    oxyCODONE (ROXICODONE) 5 MG immediate release tablet  Every 4 hours PRN        11/26/21 2009    methocarbamol (ROBAXIN) 500 MG tablet  2 times daily        11/26/21 2009    bacitracin ointment  2 times daily        11/26/21  2009              Zylah Elsbernd A, PA-C 11/26/21 2011    Franne Forts, DO 11/26/21 2318

## 2021-11-26 NOTE — Discharge Instructions (Addendum)
Take the medication as prescribed.  Please use caution as 1 this medication is an opiate medication does have the addictive potential.  Do not drive or operate heavy machinery while taking this medication.  Also use the wound care ointment.  Please ice and elevate the leg

## 2022-02-28 ENCOUNTER — Encounter (HOSPITAL_COMMUNITY): Payer: Self-pay

## 2022-02-28 ENCOUNTER — Inpatient Hospital Stay (HOSPITAL_BASED_OUTPATIENT_CLINIC_OR_DEPARTMENT_OTHER)
Admission: EM | Admit: 2022-02-28 | Discharge: 2022-03-02 | DRG: 641 | Disposition: A | Discharge status: Home / Self Care | Payer: Self-pay | Attending: Internal Medicine | Admitting: Internal Medicine

## 2022-02-28 ENCOUNTER — Emergency Department (HOSPITAL_BASED_OUTPATIENT_CLINIC_OR_DEPARTMENT_OTHER): Payer: Self-pay

## 2022-02-28 ENCOUNTER — Encounter (HOSPITAL_BASED_OUTPATIENT_CLINIC_OR_DEPARTMENT_OTHER): Payer: Self-pay | Admitting: Emergency Medicine

## 2022-02-28 ENCOUNTER — Other Ambulatory Visit: Payer: Self-pay

## 2022-02-28 DIAGNOSIS — I248 Other forms of acute ischemic heart disease: Secondary | ICD-10-CM | POA: Diagnosis present

## 2022-02-28 DIAGNOSIS — R112 Nausea with vomiting, unspecified: Secondary | ICD-10-CM | POA: Diagnosis present

## 2022-02-28 DIAGNOSIS — B159 Hepatitis A without hepatic coma: Secondary | ICD-10-CM | POA: Diagnosis present

## 2022-02-28 DIAGNOSIS — R252 Cramp and spasm: Secondary | ICD-10-CM | POA: Diagnosis present

## 2022-02-28 DIAGNOSIS — E869 Volume depletion, unspecified: Secondary | ICD-10-CM | POA: Diagnosis present

## 2022-02-28 DIAGNOSIS — E860 Dehydration: Principal | ICD-10-CM | POA: Diagnosis present

## 2022-02-28 DIAGNOSIS — Z720 Tobacco use: Secondary | ICD-10-CM | POA: Diagnosis present

## 2022-02-28 DIAGNOSIS — N179 Acute kidney failure, unspecified: Secondary | ICD-10-CM | POA: Diagnosis present

## 2022-02-28 DIAGNOSIS — F1210 Cannabis abuse, uncomplicated: Secondary | ICD-10-CM | POA: Diagnosis present

## 2022-02-28 DIAGNOSIS — K760 Fatty (change of) liver, not elsewhere classified: Secondary | ICD-10-CM | POA: Diagnosis present

## 2022-02-28 DIAGNOSIS — T679XXA Effect of heat and light, unspecified, initial encounter: Secondary | ICD-10-CM | POA: Diagnosis present

## 2022-02-28 DIAGNOSIS — Z6834 Body mass index (BMI) 34.0-34.9, adult: Secondary | ICD-10-CM

## 2022-02-28 DIAGNOSIS — Z765 Malingerer [conscious simulation]: Secondary | ICD-10-CM

## 2022-02-28 DIAGNOSIS — M6510 Other infective (teno)synovitis, unspecified site: Secondary | ICD-10-CM | POA: Diagnosis present

## 2022-02-28 DIAGNOSIS — Z79899 Other long term (current) drug therapy: Secondary | ICD-10-CM

## 2022-02-28 DIAGNOSIS — R160 Hepatomegaly, not elsewhere classified: Secondary | ICD-10-CM | POA: Diagnosis present

## 2022-02-28 DIAGNOSIS — F1110 Opioid abuse, uncomplicated: Secondary | ICD-10-CM | POA: Diagnosis present

## 2022-02-28 DIAGNOSIS — E669 Obesity, unspecified: Secondary | ICD-10-CM | POA: Diagnosis present

## 2022-02-28 DIAGNOSIS — R6510 Systemic inflammatory response syndrome (SIRS) of non-infectious origin without acute organ dysfunction: Secondary | ICD-10-CM | POA: Diagnosis present

## 2022-02-28 DIAGNOSIS — Z885 Allergy status to narcotic agent status: Secondary | ICD-10-CM

## 2022-02-28 DIAGNOSIS — Z87442 Personal history of urinary calculi: Secondary | ICD-10-CM

## 2022-02-28 DIAGNOSIS — R109 Unspecified abdominal pain: Secondary | ICD-10-CM | POA: Diagnosis present

## 2022-02-28 DIAGNOSIS — F17210 Nicotine dependence, cigarettes, uncomplicated: Secondary | ICD-10-CM | POA: Diagnosis present

## 2022-02-28 DIAGNOSIS — L02511 Cutaneous abscess of right hand: Secondary | ICD-10-CM | POA: Diagnosis present

## 2022-02-28 DIAGNOSIS — R778 Other specified abnormalities of plasma proteins: Secondary | ICD-10-CM | POA: Diagnosis present

## 2022-02-28 HISTORY — DX: Malingerer (conscious simulation): Z76.5

## 2022-02-28 LAB — CK: Total CK: 218 U/L (ref 49–397)

## 2022-02-28 LAB — CBC WITH DIFFERENTIAL/PLATELET
Abs Immature Granulocytes: 0.25 10*3/uL — ABNORMAL HIGH (ref 0.00–0.07)
Basophils Absolute: 0.1 10*3/uL (ref 0.0–0.1)
Basophils Relative: 1 %
Eosinophils Absolute: 0 10*3/uL (ref 0.0–0.5)
Eosinophils Relative: 0 %
HCT: 51.8 % (ref 39.0–52.0)
Hemoglobin: 18.1 g/dL — ABNORMAL HIGH (ref 13.0–17.0)
Immature Granulocytes: 1 %
Lymphocytes Relative: 17 %
Lymphs Abs: 4 10*3/uL (ref 0.7–4.0)
MCH: 31.4 pg (ref 26.0–34.0)
MCHC: 34.9 g/dL (ref 30.0–36.0)
MCV: 89.8 fL (ref 80.0–100.0)
Monocytes Absolute: 2.3 10*3/uL — ABNORMAL HIGH (ref 0.1–1.0)
Monocytes Relative: 10 %
Neutro Abs: 17 10*3/uL — ABNORMAL HIGH (ref 1.7–7.7)
Neutrophils Relative %: 71 %
Platelets: 337 10*3/uL (ref 150–400)
RBC: 5.77 MIL/uL (ref 4.22–5.81)
RDW: 12.8 % (ref 11.5–15.5)
WBC: 23.7 10*3/uL — ABNORMAL HIGH (ref 4.0–10.5)
nRBC: 0 % (ref 0.0–0.2)

## 2022-02-28 LAB — COMPREHENSIVE METABOLIC PANEL
ALT: 120 U/L — ABNORMAL HIGH (ref 0–44)
AST: 66 U/L — ABNORMAL HIGH (ref 15–41)
Albumin: 5.9 g/dL — ABNORMAL HIGH (ref 3.5–5.0)
Alkaline Phosphatase: 114 U/L (ref 38–126)
Anion gap: 16 — ABNORMAL HIGH (ref 5–15)
BUN: 23 mg/dL — ABNORMAL HIGH (ref 6–20)
CO2: 19 mmol/L — ABNORMAL LOW (ref 22–32)
Calcium: 11.3 mg/dL — ABNORMAL HIGH (ref 8.9–10.3)
Chloride: 100 mmol/L (ref 98–111)
Creatinine, Ser: 3.34 mg/dL — ABNORMAL HIGH (ref 0.61–1.24)
GFR, Estimated: 24 mL/min — ABNORMAL LOW (ref >60)
Glucose, Bld: 128 mg/dL — ABNORMAL HIGH (ref 70–99)
Potassium: 4.7 mmol/L (ref 3.5–5.1)
Sodium: 135 mmol/L (ref 135–145)
Total Bilirubin: 1 mg/dL (ref 0.3–1.2)
Total Protein: 10.7 g/dL — ABNORMAL HIGH (ref 6.5–8.1)

## 2022-02-28 LAB — LIPASE, BLOOD: Lipase: 46 U/L (ref 11–51)

## 2022-02-28 LAB — TROPONIN I (HIGH SENSITIVITY): Troponin I (High Sensitivity): 22 ng/L — ABNORMAL HIGH (ref <18)

## 2022-02-28 LAB — MAGNESIUM: Magnesium: 2.1 mg/dL (ref 1.7–2.4)

## 2022-02-28 LAB — LACTIC ACID, PLASMA: Lactic Acid, Venous: 1.6 mmol/L (ref 0.5–1.9)

## 2022-02-28 MED ORDER — SODIUM CHLORIDE 0.9 % IV SOLN
2.0000 g | Freq: Two times a day (BID) | INTRAVENOUS | Status: DC
  Administered 2022-03-01 (×2): 2 g via INTRAVENOUS
  Filled 2022-02-28 (×2): qty 12.5

## 2022-02-28 MED ORDER — SODIUM CHLORIDE 0.9 % IV BOLUS
1000.0000 mL | Freq: Once | INTRAVENOUS | Status: AC
  Administered 2022-02-28: 1000 mL via INTRAVENOUS

## 2022-02-28 MED ORDER — VANCOMYCIN HCL IN DEXTROSE 1-5 GM/200ML-% IV SOLN
1000.0000 mg | Freq: Once | INTRAVENOUS | Status: AC
  Administered 2022-02-28: 1000 mg via INTRAVENOUS
  Filled 2022-02-28: qty 200

## 2022-02-28 MED ORDER — VANCOMYCIN HCL IN DEXTROSE 1-5 GM/200ML-% IV SOLN
1000.0000 mg | Freq: Once | INTRAVENOUS | Status: AC
  Administered 2022-03-01: 1000 mg via INTRAVENOUS
  Filled 2022-02-28: qty 200

## 2022-02-28 MED ORDER — VANCOMYCIN VARIABLE DOSE PER UNSTABLE RENAL FUNCTION (PHARMACIST DOSING)
Status: DC
  Filled 2022-02-28: qty 1

## 2022-02-28 MED ORDER — FENTANYL CITRATE PF 50 MCG/ML IJ SOSY
50.0000 ug | PREFILLED_SYRINGE | Freq: Once | INTRAMUSCULAR | Status: AC
  Administered 2022-02-28: 50 ug via INTRAVENOUS
  Filled 2022-02-28: qty 1

## 2022-02-28 MED ORDER — VANCOMYCIN HCL 10 G IV SOLR: 2250.0000 mg | Freq: Once | INTRAVENOUS | Status: DC

## 2022-02-28 NOTE — ED Triage Notes (Addendum)
Pt reports cramping in abdomen, lower back, and BLLE that started today. Reports hx of AKI due to dehydration 4 years ago. States feels the same. States no urine output today.

## 2022-02-28 NOTE — Progress Notes (Addendum)
Pharmacy Antibiotic Note  Jeffree Cazeau is a 31 y.o. male admitted on 02/28/2022 with sepsis.  Pharmacy has been consulted for Vancomycin and cefepime dosing dosing. Patient presented with abdominal cramps, lower back, and BLLE. Patient has  history of AKI and has had no urine output today. WBC elevated at 23.7, ANC 17, and afebrile. Scr is 3.34 (bl~1) and CrCl 40.66ml/min.  Plan: Vancomycin 2000mg  x1, repeat dosing based on Scr tomorrow. Cefepime 2g q 12h Monitor kidney function, cultures, and signs of clinical improvement   Weight: 113.4 kg (250 lb)  Temp (24hrs), Avg:97.5 F (36.4 C), Min:97.5 F (36.4 C), Max:97.5 F (36.4 C)  Recent Labs  Lab 02/28/22 2012  WBC 23.7*  CREATININE 3.34*    Estimated Creatinine Clearance: 40.6 mL/min (A) (by C-G formula based on SCr of 3.34 mg/dL (H)).    Allergies  Allergen Reactions   Hydrocodone Hives    Antimicrobials this admission: 9/6 Vanc >>  9/6 Cefepime >>   Microbiology results: 9/6 BCx:   Thank you for allowing pharmacy to be a part of this patient's care.  11/6, PharmD. [facility] PGY-1 02/28/2022 9:54 PM

## 2022-02-28 NOTE — ED Provider Notes (Signed)
[facility] EMERGENCY DEPARTMENT Provider Note   CSN: [id number] Arrival date & time: 02/28/22  1953     History  Chief Complaint  Patient presents with   Dehydration    Charles Fritz is a 31 y.o. male with no significant PMH who presents to the ED due to full body cramps.  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  Patient states around 10:30 AM he began to feel cramping throughout his body.  Patient states he attempted to drink some water; however, began vomiting numerous times.  No blood.  Patient states he had a similar episode in the past. He admits to decreased po intake since early this morning. He endorses diaphoresis.   History obtained from patient and past medical records. No interpreter used during encounter.       Home Medications Prior to Admission medications   Medication Sig Start Date End Date Taking? Authorizing Provider  bacitracin ointment Apply 1 application. topically 2 (two) times daily. 11/26/21   Henderly, Britni A, PA-C  benzonatate (TESSALON) 100 MG capsule Take 1 capsule (100 mg total) by mouth 3 (three) times daily as needed for cough. 04/22/21   Haskel Schroeder, PA-C  methocarbamol (ROBAXIN) 500 MG tablet Take 1 tablet (500 mg total) by mouth 2 (two) times daily. 11/26/21   Henderly, Britni A, PA-C  oxyCODONE (ROXICODONE) 5 MG immediate release tablet Take 1 tablet (5 mg total) by mouth every 4 (four) hours as needed for severe pain. 11/26/21   Henderly, Britni A, PA-C      Allergies    Hydrocodone    Review of Systems   Review of Systems  Constitutional:  Positive for activity change, appetite change, diaphoresis and fatigue.  Gastrointestinal:  Positive for abdominal pain.  Musculoskeletal:  Positive for myalgias.  Neurological:  Positive for weakness and light-headedness.    Physical Exam Updated Vital Signs BP (!) 145/97   Pulse 86    Temp 98.1 F (36.7 C) (Oral)   Resp (!) 22   Wt 113.4 kg   SpO2 96%   BMI 34.87 kg/m  Physical Exam Vitals and nursing note reviewed.  Constitutional:      General: He is not in acute distress.    Appearance: He is ill-appearing.  HENT:     Head: Normocephalic.  Eyes:     Pupils: Pupils are equal, round, and reactive to light.  Cardiovascular:     Rate and Rhythm: Normal rate and regular rhythm.     Pulses: Normal pulses.     Heart sounds: Normal heart sounds. No murmur heard.    No friction rub. No gallop.  Pulmonary:     Effort: Pulmonary effort is normal.     Breath sounds: Normal breath sounds.  Abdominal:     General: Abdomen is flat. There is no distension.     Palpations: Abdomen is soft.     Tenderness: There is no abdominal tenderness. There is no guarding or rebound.  Musculoskeletal:        General: Normal range of motion.     Cervical back: Neck supple.  Skin:    General: Skin is warm and dry.  Neurological:     General: No focal deficit present.     Mental Status: He is alert.  Psychiatric:        Mood and Affect: Mood normal.  Behavior: Behavior normal.     ED Results / Procedures / Treatments   Labs (all labs ordered are listed, but only abnormal results are displayed) Labs Reviewed  COMPREHENSIVE METABOLIC PANEL - Abnormal; Notable for the following components:      Result Value   CO2 19 (*)    Glucose, Bld 128 (*)    BUN 23 (*)    Creatinine, Ser 3.34 (*)    Calcium 11.3 (*)    Total Protein 10.7 (*)    Albumin 5.9 (*)    AST 66 (*)    ALT 120 (*)    GFR, Estimated 24 (*)    Anion gap 16 (*)    All other components within normal limits  CBC WITH DIFFERENTIAL/PLATELET - Abnormal; Notable for the following components:   WBC 23.7 (*)    Hemoglobin 18.1 (*)    Neutro Abs 17.0 (*)    Monocytes Absolute 2.3 (*)    Abs Immature Granulocytes 0.25 (*)    All other components within normal limits  TROPONIN I (HIGH SENSITIVITY) -  Abnormal; Notable for the following components:   Troponin I (High Sensitivity) 22 (*)    All other components within normal limits  CULTURE, BLOOD (ROUTINE X 2)  CULTURE, BLOOD (ROUTINE X 2)  LIPASE, BLOOD  CK  MAGNESIUM  LACTIC ACID, PLASMA  URINALYSIS, ROUTINE W REFLEX MICROSCOPIC  RAPID URINE DRUG SCREEN, HOSP PERFORMED  LACTIC ACID, PLASMA    EKG None  Radiology CT Renal Stone Study  Result Date: 02/28/2022 CLINICAL DATA:  Flank pain, kidney stone suspected EXAM: CT ABDOMEN AND PELVIS WITHOUT CONTRAST TECHNIQUE: Multidetector CT imaging of the abdomen and pelvis was performed following the standard protocol without IV contrast. RADIATION DOSE REDUCTION: This exam was performed according to the departmental dose-optimization program which includes automated exposure control, adjustment of the mA and/or kV according to patient size and/or use of iterative reconstruction technique. COMPARISON:  CT abdomen pelvis 03/14/2021, CT abdomen pelvis 03/14/2021 FINDINGS: Lower chest: No acute abnormality. Hepatobiliary: Liver is enlarged measuring up to 20.5 cm. The hepatic parenchyma is markedly diffusely hypodense compared to the splenic parenchyma consistent with fatty infiltration. No focal liver abnormality. No gallstones, gallbladder wall thickening, or pericholecystic fluid. No biliary dilatation. Pancreas: No focal lesion. Normal pancreatic contour. No surrounding inflammatory changes. No main pancreatic ductal dilatation. Spleen: Normal in size without focal abnormality. Adrenals/Urinary Tract: No adrenal nodule bilaterally. Bilateral kidneys enhance symmetrically. Slightly malrotated left kidney. No hydronephrosis. No hydroureter. The urinary bladder is unremarkable. Stomach/Bowel: Stomach is within normal limits. No evidence of bowel wall thickening or dilatation. Appendix appears normal. Vascular/Lymphatic: No abdominal aorta or iliac aneurysm. Mild atherosclerotic plaque of the aorta and its  branches. No abdominal, pelvic, or inguinal lymphadenopathy. Reproductive: Prostate is unremarkable. Other: No intraperitoneal free fluid. No intraperitoneal free gas. No organized fluid collection. Musculoskeletal: No abdominal wall hernia or abnormality. No suspicious lytic or blastic osseous lesions. No acute displaced fracture. Multilevel degenerative changes of the spine. IMPRESSION: 1. Severe hepatic steatosis and hepatomegaly. 2. No acute intra-abdominal or intrapelvic abnormality. Electronically Signed   By: Iven Finn M.D.   On: 02/28/2022 22:23    Procedures .Critical Care  Performed by: Suzy Bouchard, PA-C Authorized by: Suzy Bouchard, PA-C   Critical care provider statement:    Critical care time (minutes):  30   Critical care time was exclusive of:  Separately billable procedures and treating other patients and teaching time   Critical care was  necessary to treat or prevent imminent or life-threatening deterioration of the following conditions:  Renal failure   Critical care was time spent personally by me on the following activities:  Development of treatment plan with patient or surrogate, discussions with consultants, evaluation of patient's response to treatment, examination of patient, ordering and review of laboratory studies, ordering and review of radiographic studies, ordering and performing treatments and interventions, pulse oximetry, re-evaluation of patient's condition and review of old charts   I assumed direction of critical care for this patient from another provider in my specialty: no     Care discussed with: admitting provider       Medications Ordered in ED Medications  ceFEPIme (MAXIPIME) 2 g in sodium chloride 0.9 % 100 mL IVPB (has no administration in time range)  vancomycin (VANCOCIN) IVPB 1000 mg/200 mL premix (1,000 mg Intravenous New Bag/Given 02/28/22 2305)    Followed by  vancomycin (VANCOCIN) IVPB 1000 mg/200 mL premix (has no  administration in time range)  vancomycin variable dose per unstable renal function (pharmacist dosing) (has no administration in time range)  sodium chloride 0.9 % bolus 1,000 mL ( Intravenous Stopped 02/28/22 2210)  sodium chloride 0.9 % bolus 1,000 mL ( Intravenous Stopped 02/28/22 2214)  sodium chloride 0.9 % bolus 1,000 mL (1,000 mLs Intravenous New Bag/Given 02/28/22 2305)  fentaNYL (SUBLIMAZE) injection 50 mcg (50 mcg Intravenous Given 02/28/22 2306)    ED Course/ Medical Decision Making/ A&P Clinical Course as of 02/28/22 2313  Wed Feb 28, 2022  2026 New likely rhabdo [CC]  2045 WBC(!): 23.7 [CA]  2053 Creatinine(!): 3.34 [CA]  2053 BUN(!): 23 [CA]  2124 CK Total: 218 CK normal. Will add lactic acid and blood cultures and start on IV antibiotics. [CA]  2222 Vitals have normalized after 2L IVFs. [CA]    Clinical Course User Index [CA] Mannie Stabile, PA-C [CC] Glyn Ade, MD                           Medical Decision Making Amount and/or Complexity of Data Reviewed Independent Historian: spouse    Details: Fiance at bedside provided some history External Data Reviewed: notes. Labs: ordered. Decision-making details documented in ED Course. Radiology: ordered and independent interpretation performed. Decision-making details documented in ED Course. ECG/medicine tests: ordered and independent interpretation performed. Decision-making details documented in ED Course.  Risk Prescription drug management. Decision regarding hospitalization.   This patient presents to the ED for concern of myalgias, this involves an extensive number of treatment options, and is a complaint that carries with it a high risk of complications and morbidity.  The differential diagnosis includes rhabdomyolysis, AKI, COVID, dissection, etc   31 year old male presents to the ED due to full body cramps that started earlier today.  Patient works outside cutting down trees.  Patient was in his normal  state of health earlier today and began working around 830 this morning.  Around 10:30 AM full body cramps began.  Patient also admits to numerous episodes of nonbloody, nonbilious emesis and inability to tolerate p.o.  Denies chest pain and shortness of breath. Low suspicion for dissection. Upon arrival, patient tachycardic.  Patient diaphoretic.  Abdomen soft, nondistended, nontender.  Patient answering questions appropriately.  Possible rhabdomyolysis, CK ordered. Routine labs to check renal function. IV fluids started. Discussed with Dr. Doran Durand who evaluated patient and agrees with assessment and plan.   CBC significant for leukocytosis at 23.7 possibly reactive?  CMP  significant for AKI with creatinine 3.3 and BUN at 23. Baseline normal renal function. Transaminitis with AST at 66 and ALT at 120.  Lipase normal at 46.  Doubt pancreatitis.  CK normal at 218. IV antibiotics started given patient meets SIRS criteria. CT renal study personally reviewed and interpreted which is negative for any acute abnormalities. Does show severe hepatic steatosis and hepatomegaly.   11:13 PM reassessed patient. Patient endorses continued back cramps. Patient appears better at bedside. No signs of distress.   10:55 PM reassessed patient at bedside.  Patient appears slightly improved.  Complaining of back pain.  Fentanyl order placed.  IV fluids continued.  11:04 PM Discussed with Dr. Nevada Crane with [facility] who agrees to admit patient.         Final Clinical Impression(s) / ED Diagnoses Final diagnoses:  AKI (acute kidney injury) [facility])    Rx / [place] Orders ED Discharge Orders     None         Karie Kirks 02/28/22 2315    Tretha Sciara, MD 02/28/22 2351

## 2022-03-01 ENCOUNTER — Encounter (HOSPITAL_COMMUNITY): Payer: Self-pay | Admitting: Internal Medicine

## 2022-03-01 DIAGNOSIS — R6510 Systemic inflammatory response syndrome (SIRS) of non-infectious origin without acute organ dysfunction: Secondary | ICD-10-CM | POA: Diagnosis present

## 2022-03-01 DIAGNOSIS — F17210 Nicotine dependence, cigarettes, uncomplicated: Secondary | ICD-10-CM | POA: Insufficient documentation

## 2022-03-01 DIAGNOSIS — R7989 Other specified abnormal findings of blood chemistry: Secondary | ICD-10-CM | POA: Diagnosis present

## 2022-03-01 DIAGNOSIS — R160 Hepatomegaly, not elsewhere classified: Secondary | ICD-10-CM | POA: Diagnosis present

## 2022-03-01 DIAGNOSIS — K760 Fatty (change of) liver, not elsewhere classified: Secondary | ICD-10-CM | POA: Diagnosis present

## 2022-03-01 DIAGNOSIS — E669 Obesity, unspecified: Secondary | ICD-10-CM | POA: Diagnosis present

## 2022-03-01 DIAGNOSIS — N179 Acute kidney failure, unspecified: Secondary | ICD-10-CM

## 2022-03-01 DIAGNOSIS — E66811 Obesity, class 1: Secondary | ICD-10-CM | POA: Diagnosis present

## 2022-03-01 DIAGNOSIS — R778 Other specified abnormalities of plasma proteins: Secondary | ICD-10-CM | POA: Diagnosis present

## 2022-03-01 LAB — HEPATIC FUNCTION PANEL
ALT: 78 U/L — ABNORMAL HIGH (ref 0–44)
AST: 47 U/L — ABNORMAL HIGH (ref 15–41)
Albumin: 3.9 g/dL (ref 3.5–5.0)
Alkaline Phosphatase: 74 U/L (ref 38–126)
Bilirubin, Direct: 0.1 mg/dL (ref 0.0–0.2)
Indirect Bilirubin: 0.6 mg/dL (ref 0.3–0.9)
Total Bilirubin: 0.7 mg/dL (ref 0.3–1.2)
Total Protein: 7.2 g/dL (ref 6.5–8.1)

## 2022-03-01 LAB — CBC
HCT: 40.2 % (ref 39.0–52.0)
Hemoglobin: 13.8 g/dL (ref 13.0–17.0)
MCH: 31.7 pg (ref 26.0–34.0)
MCHC: 34.3 g/dL (ref 30.0–36.0)
MCV: 92.4 fL (ref 80.0–100.0)
Platelets: 186 10*3/uL (ref 150–400)
RBC: 4.35 MIL/uL (ref 4.22–5.81)
RDW: 12.8 % (ref 11.5–15.5)
WBC: 12.8 10*3/uL — ABNORMAL HIGH (ref 4.0–10.5)
nRBC: 0 % (ref 0.0–0.2)

## 2022-03-01 LAB — URINALYSIS, MICROSCOPIC (REFLEX)

## 2022-03-01 LAB — URINALYSIS, ROUTINE W REFLEX MICROSCOPIC
Bilirubin Urine: NEGATIVE
Glucose, UA: NEGATIVE mg/dL
Ketones, ur: NEGATIVE mg/dL
Leukocytes,Ua: NEGATIVE
Nitrite: NEGATIVE
Protein, ur: 100 mg/dL — AB
Specific Gravity, Urine: >=1.03 (ref 1.005–1.030)
pH: 5.5 (ref 5.0–8.0)

## 2022-03-01 LAB — HEPATITIS PANEL, ACUTE
HCV Ab: NONREACTIVE
Hep A IgM: NONREACTIVE
Hep B C IgM: NONREACTIVE
Hepatitis B Surface Ag: NONREACTIVE

## 2022-03-01 LAB — LACTIC ACID, PLASMA: Lactic Acid, Venous: 1.6 mmol/L (ref 0.5–1.9)

## 2022-03-01 LAB — RAPID URINE DRUG SCREEN, HOSP PERFORMED
Amphetamines: POSITIVE — AB
Barbiturates: NOT DETECTED
Benzodiazepines: NOT DETECTED
Cocaine: NOT DETECTED
Opiates: NOT DETECTED
Tetrahydrocannabinol: POSITIVE — AB

## 2022-03-01 LAB — TROPONIN I (HIGH SENSITIVITY): Troponin I (High Sensitivity): 19 ng/L — ABNORMAL HIGH (ref <18)

## 2022-03-01 MED ORDER — ACETAMINOPHEN 650 MG RE SUPP: 650.0000 mg | Freq: Four times a day (QID) | RECTAL | Status: DC | PRN

## 2022-03-01 MED ORDER — LACTATED RINGERS IV SOLN: INTRAVENOUS | Status: DC

## 2022-03-01 MED ORDER — TRAMADOL HCL 50 MG PO TABS: 50.0000 mg | ORAL_TABLET | Freq: Four times a day (QID) | ORAL | Status: DC | PRN

## 2022-03-01 MED ORDER — ONDANSETRON HCL 4 MG/2ML IJ SOLN: 4.0000 mg | Freq: Four times a day (QID) | INTRAMUSCULAR | Status: DC | PRN

## 2022-03-01 MED ORDER — ONDANSETRON HCL 4 MG PO TABS: 4.0000 mg | ORAL_TABLET | Freq: Four times a day (QID) | ORAL | Status: DC | PRN

## 2022-03-01 MED ORDER — METHOCARBAMOL 500 MG PO TABS: 1000.0000 mg | ORAL_TABLET | Freq: Four times a day (QID) | ORAL | Status: DC | PRN

## 2022-03-01 MED ORDER — LACTATED RINGERS IV BOLUS
1000.0000 mL | Freq: Once | INTRAVENOUS | Status: AC
  Administered 2022-03-01: 1000 mL via INTRAVENOUS

## 2022-03-01 MED ORDER — ENOXAPARIN SODIUM 40 MG/0.4ML IJ SOSY: 40.0000 mg | PREFILLED_SYRINGE | INTRAMUSCULAR | Status: DC

## 2022-03-01 MED ORDER — ACETAMINOPHEN 325 MG PO TABS: 650.0000 mg | ORAL_TABLET | Freq: Four times a day (QID) | ORAL | Status: DC | PRN

## 2022-03-01 NOTE — Progress Notes (Signed)
Antibiotic consult was received from an ED provider for vancomycin and cefepime per pharmacy dosing.  Pt transferred from the ED at [facility] where initial doses were received.   Further antibiotics/pharmacy consults should be ordered by admitting physician if indicated.                       Thank you, Cindi Carbon, PharmD 03/01/2022  11:16 AM

## 2022-03-01 NOTE — ED Notes (Signed)
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

## 2022-03-01 NOTE — H&P (Signed)
History and Physical    Patient: Charles Fritz IZT:[id number] DOB: 28-Jul-1990 DOA: 02/28/2022 DOS: the patient was seen and examined on 03/01/2022 PCP: Patient, No Pcp Per  Patient coming from: Home  Chief Complaint:  Chief Complaint  Patient presents with   Dehydration   HPI: Charles Fritz is a 31 y.o. male with medical history significant of methadone, marijuana and tobacco abuse, history of AKI, history of nephrolithiasis, malingering, class I obesity with a BMI of 34.87 kg/m, hepatitis A, infectious tenosynovitis, abscess of the right thumb, history of AKI due to dehydration 4 years ago who came into the emergency department complaints of having no urine output during the day after working outside sweating profusely and not drinking enough fluids during the morning.  He tried to drink fluids and got the [facility] of his vehicle running so he could cool off.  He subsequently developed abdominal pain with nausea/vomiting and was unable to hydrate himself.  He also was having muscle cramps so his extremities and abdomen.  No diarrhea, constipation, melena or hematochezia.  No flank pain, dysuria, frequency or hematuria.He denied fever, chills, rhinorrhea, sore throat, wheezing or hemoptysis.  No chest pain, palpitations, diaphoresis, PND, orthopnea or pitting edema of the lower extremities.   No polyuria, polydipsia, polyphagia or blurred vision.   ED course: Initial vital signs were temperature 97.5 F, pulse 130, respiration 20, BP 108/90 mmHg O2 sat 96% on room air.  The patient received 3000 mL of normal saline bolus, 2000 mg of vancomycin and fentanyl 50 mcg IVP.  Lab work: Urinalysis with trace hemoglobinuria and proteinuria 100 mg/dL with rare bacteria microscopic examination.  UDS was positive for amphetamines and THC.  CBC showed a white count 23.7 with 71% neutrophils, hemoglobin 18.1 g deciliter platelets 337.  Lactic acid was normal twice.  Troponin was 22 then 19 ng/L.  Total CK2  118 and lipase 46 units/L.  CMP showed a CO2 of 19 mmol/L with an anion gap of 16.  Glucose 128, BUN 23, creatinine 3.34 and calcium 11.30 mg/dL.  Total protein 10.7 and albumin 5.9 g/dL.  AST 66 and ALT 120 units/L.  Normal alkaline phosphatase, total bilirubin, sodium, potassium and chloride.  Imaging: CT renal study did not show any acute intra-abdominal or intrapelvic abnormality.  There was severe hepatic asteatosis and hepatomegaly.   Review of Systems: As mentioned in the history of present illness. All other systems reviewed and are negative.  Past Medical History:  Diagnosis Date   History of substance abuse (HCC) 04/22/2021   last used methadone 7 months ago.  Marijuana occasionally   Kidney failure, acute (HCC)    Kidney stone    Malingering    Past Surgical History:  Procedure Laterality Date   FINGER SURGERY     Social History:  reports that he has been smoking cigarettes. He has a 7.50 pack-year smoking history. He has never used smokeless tobacco. He reports that he does not currently use alcohol. He reports current drug use. Drug: Marijuana.  Allergies  Allergen Reactions   Hydrocodone Hives    Family History  Problem Relation Age of Onset   Healthy Mother    Healthy Father    Healthy Sister    Healthy Brother     Prior to Admission medications   Medication Sig Start Date End Date Taking? Authorizing Provider  bacitracin ointment Apply 1 application. topically 2 (two) times daily. 11/26/21   Henderly, Britni A, PA-C  benzonatate (TESSALON) 100 MG capsule  Take 1 capsule (100 mg total) by mouth 3 (three) times daily as needed for cough. 04/22/21   Haskel Schroeder, PA-C  methocarbamol (ROBAXIN) 500 MG tablet Take 1 tablet (500 mg total) by mouth 2 (two) times daily. 11/26/21   Henderly, Britni A, PA-C  oxyCODONE (ROXICODONE) 5 MG immediate release tablet Take 1 tablet (5 mg total) by mouth every 4 (four) hours as needed for severe pain. 11/26/21   Henderly, Britni A,  PA-C    Physical Exam: Vitals:   03/01/22 0500 03/01/22 0754 03/01/22 0830 03/01/22 1031  BP: 129/82 (!) 143/100 123/67 (!) 134/96  Pulse: 87 82 76 65  Resp: 15 18 16 16   Temp: 98.1 F (36.7 C)   (!) 97.5 F (36.4 C)  TempSrc: Oral   Oral  SpO2: 99% 99% 100% 100%  Weight:       Physical Exam Vitals and nursing note reviewed.  Constitutional:      General: He is awake. He is not in acute distress.    Appearance: He is obese. He is not ill-appearing.  HENT:     Head: Normocephalic.     Nose: No rhinorrhea.     Mouth/Throat:     Mouth: Mucous membranes are dry.  Eyes:     General: No scleral icterus.    Pupils: Pupils are equal, round, and reactive to light.  Neck:     Vascular: No JVD.  Cardiovascular:     Rate and Rhythm: Normal rate and regular rhythm.     Heart sounds: S1 normal and S2 normal.  Pulmonary:     Effort: Pulmonary effort is normal.     Breath sounds: Normal breath sounds. No wheezing, rhonchi or rales.  Abdominal:     General: Bowel sounds are normal. There is no distension.     Palpations: Abdomen is soft.     Tenderness: There is right CVA tenderness. There is no guarding.  Musculoskeletal:     Cervical back: Neck supple.     Right lower leg: No edema.     Left lower leg: No edema.  Skin:    General: Skin is warm and dry.  Neurological:     General: No focal deficit present.     Mental Status: He is alert and oriented to person, place, and time.  Psychiatric:        Mood and Affect: Mood normal.        Behavior: Behavior normal. Behavior is cooperative.   Data Reviewed:  There are no new results to review at this time.  Assessment and Plan: Principal Problem:   AKI (acute kidney injury) (HCC) Observation/telemetry. LR 2000 mL bolus x1. Continue IV fluids. Avoid hypotension. Avoid nephrotoxins. Monitor intake and output. Monitor renal function electrolytes.  Active Problems:   SIRS (systemic inflammatory response syndrome)  (HCC) Much improved after hydration. Continue IV fluids. No need for IV antibiotic therapy. Monitor CBC and chemistry.   Hepatomegaly Associated with:   Steatosis of liver Briefly discussed with the patient. He does not drink alcohol. Check acute hepatitis profile. No history of IVDA. 100 pounds gain since quitting methamphetamines. Further work-up depending on hepatitis profile result. Inpatient or outpatient evaluation by gastroenterology.    Class 1 obesity Lifestyle modifications    Tobacco abuse Nicotine replacement therapy as needed. Tobacco cessation advised.    Elevated troponin In the setting of AKI. No uptrend. EKG nonischemic.      Advance Care Planning:   Code Status: Full code.  Consults:   Family Communication:   Severity of Illness: The appropriate patient status for this patient is INPATIENT. Inpatient status is judged to be reasonable and necessary in order to provide the required intensity of service to ensure the patient's safety. The patient's presenting symptoms, physical exam findings, and initial radiographic and laboratory data in the context of their chronic comorbidities is felt to place them at high risk for further clinical deterioration. Furthermore, it is not anticipated that the patient will be medically stable for discharge from the hospital within 2 midnights of admission.   * I certify that at the point of admission it is my clinical judgment that the patient will require inpatient hospital care spanning beyond 2 midnights from the point of admission due to high intensity of service, high risk for further deterioration and high frequency of surveillance required.*  Author: Bobette Mo, MD 03/01/2022 11:52 AM  For on call review [email or web address].   This document was prepared using Dragon voice recognition software and may contain some unintended transcription errors.

## 2022-03-02 LAB — COMPREHENSIVE METABOLIC PANEL
ALT: 73 U/L — ABNORMAL HIGH (ref 0–44)
AST: 45 U/L — ABNORMAL HIGH (ref 15–41)
Albumin: 3.8 g/dL (ref 3.5–5.0)
Alkaline Phosphatase: 77 U/L (ref 38–126)
Anion gap: 6 (ref 5–15)
BUN: 29 mg/dL — ABNORMAL HIGH (ref 6–20)
CO2: 23 mmol/L (ref 22–32)
Calcium: 8.7 mg/dL — ABNORMAL LOW (ref 8.9–10.3)
Chloride: 109 mmol/L (ref 98–111)
Creatinine, Ser: 1.07 mg/dL (ref 0.61–1.24)
GFR, Estimated: >60 mL/min (ref >60)
Glucose, Bld: 104 mg/dL — ABNORMAL HIGH (ref 70–99)
Potassium: 4 mmol/L (ref 3.5–5.1)
Sodium: 138 mmol/L (ref 135–145)
Total Bilirubin: 0.5 mg/dL (ref 0.3–1.2)
Total Protein: 6.6 g/dL (ref 6.5–8.1)

## 2022-03-02 LAB — HIV ANTIBODY (ROUTINE TESTING W REFLEX): HIV Screen 4th Generation wRfx: NONREACTIVE

## 2022-03-02 LAB — CBC
HCT: 38.4 % — ABNORMAL LOW (ref 39.0–52.0)
Hemoglobin: 12.9 g/dL — ABNORMAL LOW (ref 13.0–17.0)
MCH: 31.8 pg (ref 26.0–34.0)
MCHC: 33.6 g/dL (ref 30.0–36.0)
MCV: 94.6 fL (ref 80.0–100.0)
Platelets: 183 10*3/uL (ref 150–400)
RBC: 4.06 MIL/uL — ABNORMAL LOW (ref 4.22–5.81)
RDW: 12.4 % (ref 11.5–15.5)
WBC: 8.7 10*3/uL (ref 4.0–10.5)
nRBC: 0 % (ref 0.0–0.2)

## 2022-03-02 NOTE — Progress Notes (Addendum)
Pt has removed cardiac monitoring equipment and refuses to put it back on despite providing education as to what the monitor is for and why we need to use it. He states "I don't know why they have me wearing this anyway". On call provider notified, no new orders received. Mick Sell RN

## 2022-03-02 NOTE — TOC Initial Note (Signed)
Transition of Care [facility]) - Initial/Assessment Note    Patient Details  Name: Charles Fritz MRN: [id number] Date of Birth: 27-May-1991  Transition of Care [facility]) CM/SW Contact:    Golda Acre, RN Phone Number: 03/02/2022, 7:45 AM  Clinical Narrative:                  Transition of Care [facility]) Screening Note   Patient Details  Name: Charles Fritz Date of Birth: 1990-08-10   Transition of Care [facility]) CM/SW Contact:    Golda Acre, RN Phone Number: 03/02/2022, 7:45 AM    Transition of Care Department [facility]) has reviewed patient and no TOC needs have been identified at this time. We will continue to monitor patient advancement through interdisciplinary progression rounds. If new patient transition needs arise, please place a TOC consult.    Expected Discharge Plan: Home/Self Care Barriers to Discharge: Continued Medical Work up   Patient Goals and CMS Choice Patient states their goals for this hospitalization and ongoing recovery are:: to go home      Expected Discharge Plan and Services Expected Discharge Plan: Home/Self Care   Discharge Planning Services: CM Consult   Living arrangements for the past 2 months: Single Family Home                                      Prior Living Arrangements/Services Living arrangements for the past 2 months: Single Family Home Lives with:: Self Patient language and need for interpreter reviewed:: Yes Do you feel safe going back to the place where you live?: Yes               Activities of Daily Living Home Assistive Devices/Equipment: None ADL Screening (condition at time of admission) Patient's cognitive ability adequate to safely complete daily activities?: Yes Is the patient deaf or have difficulty hearing?: No Does the patient have difficulty seeing, even when wearing glasses/contacts?: No Does the patient have difficulty concentrating, remembering, or making decisions?: No Patient able to  express need for assistance with ADLs?: Yes Does the patient have difficulty dressing or bathing?: No Independently performs ADLs?: Yes (appropriate for developmental age) Does the patient have difficulty walking or climbing stairs?: No Weakness of Legs: None Weakness of Arms/Hands: None  Permission Sought/Granted                  Emotional Assessment Appearance:: Appears stated age Attitude/Demeanor/Rapport: Engaged Affect (typically observed): Calm Orientation: : Oriented to Self, Oriented to Place, Oriented to  Time, Oriented to Situation Alcohol / Substance Use: Tobacco Use, Alcohol Use, Illicit Drugs Psych Involvement: No (comment)  Admission diagnosis:  AKI (acute kidney injury) (HCC) [N17.9] Patient Active Problem List   Diagnosis Date Noted   Cigarette nicotine dependence without complication 03/01/2022   Hepatomegaly 03/01/2022   Steatosis of liver 03/01/2022   Elevated troponin 03/01/2022   SIRS (systemic inflammatory response syndrome) (HCC) 03/01/2022   Class 1 obesity 03/01/2022   AKI (acute kidney injury) (HCC) 02/28/2022   Acute renal insufficiency 01/10/2021   Malingering 09/03/2020   Hepatitis A 06/03/2019   Infectious tenosynovitis 01/16/2018   Cellulitis of right thumb 01/16/2018   Abscess of right thumb 01/15/2018   Closed nondisplaced fracture of distal phalanx of right thumb with routine healing 01/07/2018   Leukocytosis 01/18/2015   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

## 2022-03-02 NOTE — Hospital Course (Addendum)
31 yo with medical history significant of methadone, marijuana and tobacco abuse, history of AKI, history of nephrolithiasis, malingering, class I obesity with a BMI of 34.87 kg/m, hepatitis A, infectious tenosynovitis, abscess of the right thumb, history of AKI due to dehydration 4 years ago who came into the ED with c/o of having no urine output during the day after working outside sweating profusely and not drinking enough fluids during the morning, was having full body cramping. In ED He is found to have a creatinine of 3.3, from a normal baseline, 5 months ago.Due to concern for SIRS-with leukocytosis, the patient was started on IV antibiotics empirically, UA no evidence of UTI, CT renal study-severe hepatic steatosis and hepatomegaly, no other acute findings. Trop falt 22>19-likely demand ischemia due to AKI. Aggressively hydrated, subsequently leukocytosis resolved AKI resolved, has been afebrile, blood culture no growth so far from 9/6.  At this time is medically stable for discharge

## 2022-03-02 NOTE — Discharge Summary (Signed)
Physician Discharge Summary  Charles Fritz NTI:[id number] DOB: 1991-05-21 DOA: 02/28/2022  PCP: Patient, No Pcp Per  Admit date: 02/28/2022 Discharge date: 03/02/2022 Recommendations for Outpatient Follow-up:  Follow up with PCP and GI in 1 weeks-call for appointment Please obtain BMP/CBC in one week  Discharge Dispo: home Discharge Condition: Stable Code Status:   Code Status: Full Code Diet recommendation:  Diet Order             Diet Heart Room service appropriate? Yes; Fluid consistency: Thin  Diet effective now                    Brief/Interim Summary: 31 yo with medical history significant of methadone, marijuana and tobacco abuse, history of AKI, history of nephrolithiasis, malingering, class I obesity with a BMI of 34.87 kg/m, hepatitis A, infectious tenosynovitis, abscess of the right thumb, history of AKI due to dehydration 31 years ago who came into the ED with c/o of having no urine output during the day after working outside sweating profusely and not drinking enough fluids during the morning, was having full body cramping. In ED He is found to have a creatinine of 3.3, from a normal baseline, 5 months ago.Due to concern for SIRS-with leukocytosis, the patient was started on IV antibiotics empirically, UA no evidence of UTI, CT renal study-severe hepatic steatosis and hepatomegaly, no other acute findings. Trop falt 22>19-likely demand ischemia due to AKI. Aggressively hydrated, subsequently leukocytosis resolved AKI resolved, has been afebrile, blood culture no growth so far from 9/6.  At this time is medically stable for discharge .  Patient confirmed he does " love weed" I explained to him extensively association with cyclic vomiting and cessation counseled.  Discharge Diagnoses:  Principal Problem:   AKI (acute kidney injury) (HCC) Active Problems:   Tobacco abuse   Hepatomegaly   Steatosis of liver   Elevated troponin   SIRS (systemic inflammatory response  syndrome) (HCC)   Class 1 obesity   AKI : Prerenal from severe volume depletion from decreased intake, heat exposure.  Resolved with IV fluids, tolerating p.o.  Outpatient follow-up with PCP  SIRS: Likely in the setting of severe dehydration leukocytosis resolved afebrile no evidence of infection in UA and CT renal study Hepatomegaly/Steatosis of liver: Likely from class I obesity acute Virals panel negative.  Will provide outpatient GI referral and also will ask him to see his PCP.  He will benefit with weight loss  Class 1 obesity:Lifestyle modifications Tobacco abuse:Nicotine replacement therapy as needed.Tobacco cessation advised. Elevated troponin:In the setting of AKI. No uptrend. EKG nonischemic. Marijuana use- I counselled in the light of his nausea and vomiting  Consults: toc Subjective: Aaox3, no nausea or vomiting, wants to go home  Discharge Exam: Vitals:   03/01/22 2252 03/02/22 0628  BP: 138/78 (!) 140/90  Pulse: 60 (!) 58  Resp: 20 20  Temp: (!) 97.5 F (36.4 C) (!) 97.5 F (36.4 C)  SpO2: 96% 97%   General: Pt is alert, awake, not in acute distress Cardiovascular: RRR, S1/S2 +, no rubs, no gallops Respiratory: CTA bilaterally, no wheezing, no rhonchi Abdominal: Soft, NT, ND, bowel sounds + Extremities: no edema, no cyanosis  Discharge Instructions  Discharge Instructions     Discharge instructions   Complete by: As directed    Please call call MD or return to ER for similar or worsening recurring problem that brought you to hospital or if any fever,nausea/vomiting,abdominal pain, uncontrolled pain, chest pain,  shortness of  breath or any other alarming symptoms.  Please avoid marijuana as it can cause cyclical vomiting which you experienced resulting into acute renal failure  Please follow-up your doctor as instructed in a week time and call the office for appointment.  Please avoid alcohol, smoking, or any other illicit substance and maintain healthy  habits including taking your regular medications as prescribed.  You were cared for by a hospitalist during your hospital stay. If you have any questions about your discharge medications or the care you received while you were in the hospital after you are discharged, you can call the unit and ask to speak with the hospitalist on call if the hospitalist that took care of you is not available.  Once you are discharged, your primary care physician will handle any further medical issues. Please note that NO REFILLS for any discharge medications will be authorized once you are discharged, as it is imperative that you return to your primary care physician (or establish a relationship with a primary care physician if you do not have one) for your aftercare needs so that they can reassess your need for medications and monitor your lab values   Increase activity slowly   Complete by: As directed       Allergies as of 03/02/2022       Reactions   Hydrocodone Hives        Medication List     STOP taking these medications    bacitracin ointment   methocarbamol 500 MG tablet Commonly known as: ROBAXIN       TAKE these medications    oxyCODONE 5 MG immediate release tablet Commonly known as: Roxicodone Take 1 tablet (5 mg total) by mouth every 4 (four) hours as needed for severe pain.        Follow-up Information     [facility] Follow up in 1 week(s).   Contact information: [address] [place] [address] [phone]        Beverley Fiedler, MD Follow up in 1 week(s).   Specialty: Gastroenterology Contact information: [address] [place] [address] [phone]                Allergies  Allergen Reactions   Hydrocodone Hives    The results of significant diagnostics from this hospitalization (including imaging, microbiology, ancillary and laboratory) are listed below for reference.     Microbiology: Recent Results (from the past 240 hour(s))  Blood culture (routine x 2)     Status: None (Preliminary result)   Collection Time: 02/28/22 10:18 PM   Specimen: BLOOD  Result Value Ref Range Status   Specimen Description   Final    BLOOD RIGHT ANTECUBITAL Performed at [facility], [address] [place], [place] [address]    Special Requests   Final    BOTTLES DRAWN AEROBIC AND ANAEROBIC Blood Culture adequate volume Performed at [facility], [address] [place], [place] [address]    Culture   Final    NO GROWTH < 24 HOURS Performed at [facility], [address], [place], [place] [address]    Report Status PENDING  Incomplete  Blood culture (routine x 2)     Status: None (Preliminary result)   Collection Time: 02/28/22 10:18 PM   Specimen: BLOOD RIGHT HAND  Result Value Ref Range Status   Specimen Description   Final  BLOOD RIGHT HAND Performed at [facility], [address] [place], [place] [address]    Special Requests   Final    BOTTLES DRAWN AEROBIC AND ANAEROBIC Blood Culture adequate volume Performed at [facility], [address] [place], [place] [address]    Culture   Final    NO GROWTH < 24 HOURS Performed at [facility], [address] [place], [place] [address]    Report Status PENDING  Incomplete    Procedures/Studies: CT Renal Stone Study  Result Date: 02/28/2022 CLINICAL DATA:  Flank pain, kidney stone suspected EXAM: CT ABDOMEN AND PELVIS WITHOUT CONTRAST TECHNIQUE: Multidetector CT imaging of the abdomen and pelvis was performed following the standard protocol without IV contrast. RADIATION DOSE REDUCTION: This exam was performed according to the departmental dose-optimization program which includes automated exposure control, adjustment of the mA and/or kV according to patient size and/or use of iterative reconstruction technique. COMPARISON:  CT abdomen pelvis 03/14/2021,  CT abdomen pelvis 03/14/2021 FINDINGS: Lower chest: No acute abnormality. Hepatobiliary: Liver is enlarged measuring up to 20.5 cm. The hepatic parenchyma is markedly diffusely hypodense compared to the splenic parenchyma consistent with fatty infiltration. No focal liver abnormality. No gallstones, gallbladder wall thickening, or pericholecystic fluid. No biliary dilatation. Pancreas: No focal lesion. Normal pancreatic contour. No surrounding inflammatory changes. No main pancreatic ductal dilatation. Spleen: Normal in size without focal abnormality. Adrenals/Urinary Tract: No adrenal nodule bilaterally. Bilateral kidneys enhance symmetrically. Slightly malrotated left kidney. No hydronephrosis. No hydroureter. The urinary bladder is unremarkable. Stomach/Bowel: Stomach is within normal limits. No evidence of bowel wall thickening or dilatation. Appendix appears normal. Vascular/Lymphatic: No abdominal aorta or iliac aneurysm. Mild atherosclerotic plaque of the aorta and its branches. No abdominal, pelvic, or inguinal lymphadenopathy. Reproductive: Prostate is unremarkable. Other: No intraperitoneal free fluid. No intraperitoneal free gas. No organized fluid collection. Musculoskeletal: No abdominal wall hernia or abnormality. No suspicious lytic or blastic osseous lesions. No acute displaced fracture. Multilevel degenerative changes of the spine. IMPRESSION: 1. Severe hepatic steatosis and hepatomegaly. 2. No acute intra-abdominal or intrapelvic abnormality. Electronically Signed   By: Tish Frederickson M.D.   On: 02/28/2022 22:23    Labs: BNP (last 3 results) No results for input(s): "BNP" in the last 8760 hours. Basic Metabolic Panel: Recent Labs  Lab 02/28/22 2012 02/28/22 2059 03/01/22 0740 03/02/22 0617  NA 135  --  137 138  K 4.7  --  3.9 4.0  CL 100  --  104 109  CO2 19*  --  23 23  GLUCOSE 128*  --  84 104*  BUN 23*  --  26* 29*  CREATININE 3.34*  --  1.84* 1.07  CALCIUM 11.3*  --  9.0  8.7*  MG  --  2.1  --   --    Liver Function Tests: Recent Labs  Lab 02/28/22 2012 03/01/22 1226 03/02/22 0617  AST 66* 47* 45*  ALT 120* 78* 73*  ALKPHOS 114 74 77  BILITOT 1.0 0.7 0.5  PROT 10.7* 7.2 6.6  ALBUMIN 5.9* 3.9 3.8   Recent Labs  Lab 02/28/22 2012  LIPASE 46   No results for input(s): "AMMONIA" in the last 168 hours. CBC: Recent Labs  Lab 02/28/22 2012 03/01/22 1226 03/02/22 0617  WBC 23.7* 12.8* 8.7  NEUTROABS 17.0*  --   --   HGB 18.1* 13.8 12.9*  HCT 51.8 40.2 38.4*  MCV 89.8 92.4 94.6  PLT 337 186 183  Cardiac Enzymes: Recent Labs  Lab 02/28/22 2012  CKTOTAL 218   BNP: Invalid input(s): "POCBNP" CBG: No results for input(s): "GLUCAP" in the last 168 hours. D-Dimer No results for input(s): "DDIMER" in the last 72 hours. Hgb A1c No results for input(s): "HGBA1C" in the last 72 hours. Lipid Profile No results for input(s): "CHOL", "HDL", "LDLCALC", "TRIG", "CHOLHDL", "LDLDIRECT" in the last 72 hours. Thyroid function studies No results for input(s): "TSH", "T4TOTAL", "T3FREE", "THYROIDAB" in the last 72 hours.  Invalid input(s): "FREET3" Anemia work up No results for input(s): "VITAMINB12", "FOLATE", "FERRITIN", "TIBC", "IRON", "RETICCTPCT" in the last 72 hours. Urinalysis    Component Value Date/Time   COLORURINE YELLOW 03/01/2022 0500   APPEARANCEUR CLEAR 03/01/2022 0500   LABSPEC >=1.030 03/01/2022 0500   PHURINE 5.5 03/01/2022 0500   GLUCOSEU NEGATIVE 03/01/2022 0500   HGBUR TRACE (A) 03/01/2022 0500   BILIRUBINUR NEGATIVE 03/01/2022 0500   KETONESUR NEGATIVE 03/01/2022 0500   PROTEINUR 100 (A) 03/01/2022 0500   NITRITE NEGATIVE 03/01/2022 0500   LEUKOCYTESUR NEGATIVE 03/01/2022 0500   Sepsis Labs Recent Labs  Lab 02/28/22 2012 03/01/22 1226 03/02/22 0617  WBC 23.7* 12.8* 8.7   Microbiology Recent Results (from the past 240 hour(s))  Blood culture (routine x 2)     Status: None (Preliminary result)   Collection  Time: 02/28/22 10:18 PM   Specimen: BLOOD  Result Value Ref Range Status   Specimen Description   Final    BLOOD RIGHT ANTECUBITAL Performed at [facility], [address] [place], [place] [address]    Special Requests   Final    BOTTLES DRAWN AEROBIC AND ANAEROBIC Blood Culture adequate volume Performed at [facility], [address] [place], [place] [address]    Culture   Final    NO GROWTH < 24 HOURS Performed at [facility], [address], [place], [place] [address]    Report Status PENDING  Incomplete  Blood culture (routine x 2)     Status: None (Preliminary result)   Collection Time: 02/28/22 10:18 PM   Specimen: BLOOD RIGHT HAND  Result Value Ref Range Status   Specimen Description   Final    BLOOD RIGHT HAND Performed at [facility], [address] [place], [place] [address]    Special Requests   Final    BOTTLES DRAWN AEROBIC AND ANAEROBIC Blood Culture adequate volume Performed at [facility], [address] [place], [place] [address]    Culture   Final    NO GROWTH < 24 HOURS Performed at [facility], [address] [place], [place] [address]    Report Status PENDING  Incomplete     Time coordinating discharge: 25 minutes  SIGNED: Lanae Boast, MD  [facility] 03/02/2022, 9:16 AM  If 7PM-7AM, please contact night-coverage [email or web address]

## 2022-03-02 NOTE — Progress Notes (Signed)
Discharge AVS discussed with patient and all questions answered. Patient alert and oriented, VS stable and able to ambulate. Patient walked off unit with guest.

## 2022-03-05 LAB — BASIC METABOLIC PANEL
Anion gap: 10 (ref 5–15)
BUN: 26 mg/dL — ABNORMAL HIGH (ref 6–20)
CO2: 23 mmol/L (ref 22–32)
Calcium: 9 mg/dL (ref 8.9–10.3)
Chloride: 104 mmol/L (ref 98–111)
Creatinine, Ser: 1.84 mg/dL — ABNORMAL HIGH (ref 0.61–1.24)
GFR, Estimated: 49 mL/min — ABNORMAL LOW (ref >60)
Glucose, Bld: 84 mg/dL (ref 70–99)
Potassium: 3.9 mmol/L (ref 3.5–5.1)
Sodium: 137 mmol/L (ref 135–145)

## 2022-03-06 LAB — CULTURE, BLOOD (ROUTINE X 2)
Culture: NO GROWTH
Culture: NO GROWTH
Special Requests: ADEQUATE
Special Requests: ADEQUATE

## 2022-05-07 ENCOUNTER — Other Ambulatory Visit: Payer: Self-pay

## 2022-05-07 ENCOUNTER — Encounter (HOSPITAL_BASED_OUTPATIENT_CLINIC_OR_DEPARTMENT_OTHER): Payer: Self-pay | Admitting: Pediatrics

## 2022-05-07 ENCOUNTER — Emergency Department (HOSPITAL_BASED_OUTPATIENT_CLINIC_OR_DEPARTMENT_OTHER)
Admission: EM | Admit: 2022-05-07 | Discharge: 2022-05-07 | Disposition: A | Discharge status: Home / Self Care | Payer: Self-pay | Attending: Emergency Medicine | Admitting: Emergency Medicine

## 2022-05-07 DIAGNOSIS — R112 Nausea with vomiting, unspecified: Secondary | ICD-10-CM | POA: Insufficient documentation

## 2022-05-07 DIAGNOSIS — R1013 Epigastric pain: Secondary | ICD-10-CM | POA: Insufficient documentation

## 2022-05-07 DIAGNOSIS — R197 Diarrhea, unspecified: Secondary | ICD-10-CM | POA: Insufficient documentation

## 2022-05-07 LAB — COMPREHENSIVE METABOLIC PANEL
ALT: 91 U/L — ABNORMAL HIGH (ref 0–44)
AST: 49 U/L — ABNORMAL HIGH (ref 15–41)
Albumin: 4.4 g/dL (ref 3.5–5.0)
Alkaline Phosphatase: 95 U/L (ref 38–126)
Anion gap: 7 (ref 5–15)
BUN: 20 mg/dL (ref 6–20)
CO2: 25 mmol/L (ref 22–32)
Calcium: 9 mg/dL (ref 8.9–10.3)
Chloride: 106 mmol/L (ref 98–111)
Creatinine, Ser: 0.88 mg/dL (ref 0.61–1.24)
GFR, Estimated: >60 mL/min (ref >60)
Glucose, Bld: 98 mg/dL (ref 70–99)
Potassium: 4.1 mmol/L (ref 3.5–5.1)
Sodium: 138 mmol/L (ref 135–145)
Total Bilirubin: 0.8 mg/dL (ref 0.3–1.2)
Total Protein: 7.8 g/dL (ref 6.5–8.1)

## 2022-05-07 LAB — CBC
HCT: 40.9 % (ref 39.0–52.0)
Hemoglobin: 14 g/dL (ref 13.0–17.0)
MCH: 31.5 pg (ref 26.0–34.0)
MCHC: 34.2 g/dL (ref 30.0–36.0)
MCV: 92.1 fL (ref 80.0–100.0)
Platelets: 211 10*3/uL (ref 150–400)
RBC: 4.44 MIL/uL (ref 4.22–5.81)
RDW: 12.2 % (ref 11.5–15.5)
WBC: 8.8 10*3/uL (ref 4.0–10.5)
nRBC: 0 % (ref 0.0–0.2)

## 2022-05-07 LAB — URINALYSIS, ROUTINE W REFLEX MICROSCOPIC
Bilirubin Urine: NEGATIVE
Glucose, UA: NEGATIVE mg/dL
Hgb urine dipstick: NEGATIVE
Ketones, ur: NEGATIVE mg/dL
Leukocytes,Ua: NEGATIVE
Nitrite: NEGATIVE
Protein, ur: NEGATIVE mg/dL
Specific Gravity, Urine: 1.025 (ref 1.005–1.030)
pH: 5 (ref 5.0–8.0)

## 2022-05-07 LAB — LIPASE, BLOOD: Lipase: 30 U/L (ref 11–51)

## 2022-05-07 MED ORDER — ONDANSETRON HCL 4 MG/2ML IJ SOLN
4.0000 mg | Freq: Once | INTRAMUSCULAR | Status: AC
  Administered 2022-05-07: 4 mg via INTRAVENOUS
  Filled 2022-05-07: qty 2

## 2022-05-07 MED ORDER — SODIUM CHLORIDE 0.9 % IV BOLUS
1000.0000 mL | Freq: Once | INTRAVENOUS | Status: AC
  Administered 2022-05-07: 1000 mL via INTRAVENOUS

## 2022-05-07 MED ORDER — FAMOTIDINE IN NACL 20-0.9 MG/50ML-% IV SOLN
20.0000 mg | Freq: Once | INTRAVENOUS | Status: AC
  Administered 2022-05-07: 20 mg via INTRAVENOUS
  Filled 2022-05-07: qty 50

## 2022-05-07 MED ORDER — MORPHINE SULFATE (PF) 4 MG/ML IV SOLN
4.0000 mg | Freq: Once | INTRAVENOUS | Status: AC
  Administered 2022-05-07: 4 mg via INTRAVENOUS
  Filled 2022-05-07: qty 1

## 2022-05-07 NOTE — ED Notes (Signed)
Provider gave food/drink for PO challenge. Tolerating well

## 2022-05-07 NOTE — ED Provider Notes (Signed)
[facility] EMERGENCY DEPARTMENT Provider Note   CSN: [id number] Arrival date & time: 05/07/22  1138     History  Chief Complaint  Patient presents with   Abdominal Pain    Charles Fritz is a 31 y.o. male.  With history of substance abuse, hepatitis A, malingering, chronic smoking, daily marijuana use who presents ED for evaluation of epigastric abdominal pain.  Patient states pain started this morning.  Has had 3 episodes of nonbloody emesis.  Also reports 1 episode of loose stool today.  Patient denies eating any suspicious foods lately.  States he has been smoking a pack a day since he was 31 years old.  Also reports daily marijuana use since he was 14.  Pain does not radiate.  No fevers or chills.  No melena or hematochezia.  No chest pain or shortness of breath.  Patient has not attempted to eat anything today.  Nausea is unrelated to food intake.  Denies alcohol use.   Abdominal Pain Associated symptoms: diarrhea, nausea and vomiting        Home Medications Prior to Admission medications   Medication Sig Start Date End Date Taking? Authorizing Provider  oxyCODONE (ROXICODONE) 5 MG immediate release tablet Take 1 tablet (5 mg total) by mouth every 4 (four) hours as needed for severe pain. 11/26/21   Henderly, Britni A, PA-C      Allergies    Hydrocodone    Review of Systems   Review of Systems  Gastrointestinal:  Positive for abdominal pain, diarrhea, nausea and vomiting.  All other systems reviewed and are negative.   Physical Exam Updated Vital Signs BP (!) 154/104   Pulse 83   Temp 98.4 F (36.9 C) (Oral)   Resp 17   Ht 5\' 11"  (1.803 m)   Wt 113.4 kg   SpO2 96%   BMI 34.87 kg/m  Physical Exam Vitals and nursing note reviewed.  Constitutional:      General: He is not in acute distress.    Appearance: Normal appearance. He is normal weight. He is not ill-appearing.  HENT:     Head: Normocephalic and atraumatic.  Cardiovascular:     Rate  and Rhythm: Normal rate and regular rhythm.     Heart sounds: Normal heart sounds.  Pulmonary:     Effort: Pulmonary effort is normal. No respiratory distress.     Breath sounds: Normal breath sounds. No stridor. No wheezing, rhonchi or rales.  Abdominal:     General: Abdomen is flat. Bowel sounds are normal.     Palpations: Abdomen is soft.     Tenderness: There is abdominal tenderness in the epigastric area. There is no guarding or rebound. Negative signs include Murphy's sign, Rovsing's sign, McBurney's sign, psoas sign and obturator sign.  Musculoskeletal:        General: Normal range of motion.     Cervical back: Neck supple.  Skin:    General: Skin is warm and dry.  Neurological:     Mental Status: He is alert and oriented to person, place, and time.  Psychiatric:        Mood and Affect: Mood normal.        Behavior: Behavior normal.     ED Results / Procedures / Treatments   Labs (all labs ordered are listed, but only abnormal results are displayed) Labs Reviewed  COMPREHENSIVE METABOLIC PANEL - Abnormal; Notable for the following components:      Result Value   AST 49 (*)  ALT 91 (*)    All other components within normal limits  LIPASE, BLOOD  CBC  URINALYSIS, ROUTINE W REFLEX MICROSCOPIC    EKG None  Radiology No results found.  Procedures Procedures    Medications Ordered in ED Medications  sodium chloride 0.9 % bolus 1,000 mL (1,000 mLs Intravenous New Bag/Given 05/07/22 1239)  ondansetron (ZOFRAN) injection 4 mg (4 mg Intravenous Given 05/07/22 1238)  morphine (PF) 4 MG/ML injection 4 mg (4 mg Intravenous Given 05/07/22 1238)  famotidine (PEPCID) IVPB 20 mg premix (20 mg Intravenous New Bag/Given 05/07/22 1238)    ED Course/ Medical Decision Making/ A&P Clinical Course as of 05/07/22 1500  Mon May 07, 2022  1426 Reevaluation shows patient's pain has been reduced to a 1 out of 10.  No nausea.  Will attempt p.o. challenge. [AS]  1453 P.o.  challenge successful. [AS]    Clinical Course User Index [AS] Ashleigh Luckow, Edsel Petrin, PA-C                           Medical Decision Making Amount and/or Complexity of Data Reviewed Labs: ordered.  Risk Prescription drug management.  This patient presents to the ED for concern of epigastric abdominal pain, nausea, vomiting, diarrhea, this involves an extensive number of treatment options, and is a complaint that carries with it a high risk of complications and morbidity.  The emergent differential diagnosis for vomiting includes, but is not limited to ACS/MI, DKA, Ischemic bowel, Meningitis, Sepsis, Acute gastric dilation, Adrenal insufficiency, Appendicitis,  Bowel obstruction/ileus, Carbon monoxide poisoning, Cholecystitis, Electrolyte abnormalities, Elevated ICP, Gastric outlet obstruction, Pancreatitis, Ruptured viscus, Biliary colic, Cannabinoid hyperemesis syndrome, Gastritis, Gastroenteritis, Gastroparesis,  Narcotic withdrawal, Peptic ulcer disease, and UTI    Co morbidities that complicate the patient evaluation   daily chronic smoking, daily marijuana use, hepatitis A  My initial workup includes  Additional history obtained from: Nursing notes from this visit.  I ordered, reviewed and interpreted labs which include: CBC, CMP, lipase urinalysis.  AST and ALT slightly elevated which is baseline for patient.  History of hepatitis A.  Rest of the labs otherwise unremarkable  Cardiac Monitoring:  The patient was maintained on a cardiac monitor.  I personally viewed and interpreted the cardiac monitored which showed an underlying rhythm of: NSR  Afebrile, hypertensive but otherwise hemodynamically stable.  Patient is a 31 year old male presents the ED for evaluation of epigastric abdominal pain, nausea, vomiting.  Symptoms started abruptly this morning.  Patient does have an extensive history of cigarette and marijuana smoking.  Physical exam remarkable for epigastric tenderness, is  otherwise unremarkable.  Lab work-up overall unremarkable.  AST and ALT slightly elevated, which is baseline for patient.  History of hepatitis A.  Patient was treated with IV fluids, Pepcid, Zofran.  Patient did pass his p.o. challenge.  Did report near resolution of his symptoms. I have a low suspicion for acute abdomen, imaging unnecessary.  I believe symptoms are likely secondary to gastritis versus cannabis hyperemesis.  Educated patient on home Pepcid and Prilosec along with dosing.  Did offer prescription for these medications but patient declined.  Strongly encouraged patient to stop smoking.  Stable at discharge.  At this time there does not appear to be any evidence of an acute emergency medical condition and the patient appears stable for discharge with appropriate outpatient follow up. Diagnosis was discussed with patient who verbalizes understanding of care plan and is agreeable to discharge. I have  discussed return precautions with patient who verbalizes understanding. Patient encouraged to follow-up with their PCP within 1 week. All questions answered.  Patient's case discussed with Dr. Adela Lank who agrees with plan to discharge with follow-up.   Note: Portions of this report may have been transcribed using voice recognition software. Every effort was made to ensure accuracy; however, inadvertent computerized transcription errors may still be present.          Final Clinical Impression(s) / ED Diagnoses Final diagnoses:  None    Rx / DC Orders ED Discharge Orders     None         Michelle Piper, PA-C 05/07/22 1541    Melene Plan, DO 05/08/22 [phone]

## 2022-05-07 NOTE — ED Triage Notes (Signed)
Reported abdominal pain, n/v and lack of energy started today; reports worst with any PO intake. Stated he has hx of renal failure. Does not take any RX meds.

## 2022-05-07 NOTE — ED Notes (Signed)
Provider at bedside

## 2022-05-07 NOTE — Discharge Instructions (Signed)
You have been seen today for your complaint of abdominal pain, nausea and vomiting. Your lab work was reassuring. Your discharge medications include Pepcid and Gas-X.  These are over-the-counter medications.  He states that Pepcid only as needed.  He states he has Prilosec once daily for at least the next 30 days.. Home care instructions are as follows:  Stop smoking.  This includes cigarettes and marijuana. Follow up with: A primary care provider of your choice Please seek immediate medical care if you develop any of the following symptoms: You vomit blood or something that looks like coffee grounds. You have black or dark red poop. You throw up any time you try to drink fluids. At this time there does not appear to be the presence of an emergent medical condition, however there is always the potential for conditions to change. Please read and follow the below instructions.  Do not take your medicine if  develop an itchy rash, swelling in your mouth or lips, or difficulty breathing; call 911 and seek immediate emergency medical attention if this occurs.  You may review your lab tests and imaging results in their entirety on your MyChart account.  Please discuss all results of fully with your primary care provider and other specialist at your follow-up visit.  Note: Portions of this text may have been transcribed using voice recognition software. Every effort was made to ensure accuracy; however, inadvertent computerized transcription errors may still be present.

## 2022-05-07 NOTE — ED Notes (Signed)
Reports improvement in pain. Fluids still infusing

## 2022-06-09 ENCOUNTER — Other Ambulatory Visit: Payer: Self-pay

## 2022-06-09 ENCOUNTER — Emergency Department (HOSPITAL_BASED_OUTPATIENT_CLINIC_OR_DEPARTMENT_OTHER)
Admission: EM | Admit: 2022-06-09 | Discharge: 2022-06-09 | Disposition: A | Discharge status: Home / Self Care | Payer: Self-pay | Attending: Emergency Medicine | Admitting: Emergency Medicine

## 2022-06-09 ENCOUNTER — Encounter (HOSPITAL_BASED_OUTPATIENT_CLINIC_OR_DEPARTMENT_OTHER): Payer: Self-pay | Admitting: Emergency Medicine

## 2022-06-09 ENCOUNTER — Emergency Department (HOSPITAL_BASED_OUTPATIENT_CLINIC_OR_DEPARTMENT_OTHER): Payer: Self-pay

## 2022-06-09 DIAGNOSIS — R112 Nausea with vomiting, unspecified: Secondary | ICD-10-CM

## 2022-06-09 DIAGNOSIS — Z20822 Contact with and (suspected) exposure to covid-19: Secondary | ICD-10-CM | POA: Insufficient documentation

## 2022-06-09 DIAGNOSIS — J101 Influenza due to other identified influenza virus with other respiratory manifestations: Secondary | ICD-10-CM

## 2022-06-09 DIAGNOSIS — R1013 Epigastric pain: Secondary | ICD-10-CM

## 2022-06-09 LAB — COMPREHENSIVE METABOLIC PANEL
ALT: 161 U/L — ABNORMAL HIGH (ref 0–44)
AST: 111 U/L — ABNORMAL HIGH (ref 15–41)
Albumin: 4.3 g/dL (ref 3.5–5.0)
Alkaline Phosphatase: 94 U/L (ref 38–126)
Anion gap: 7 (ref 5–15)
BUN: 17 mg/dL (ref 6–20)
CO2: 25 mmol/L (ref 22–32)
Calcium: 8.5 mg/dL — ABNORMAL LOW (ref 8.9–10.3)
Chloride: 101 mmol/L (ref 98–111)
Creatinine, Ser: 1.07 mg/dL (ref 0.61–1.24)
GFR, Estimated: >60 mL/min (ref >60)
Glucose, Bld: 101 mg/dL — ABNORMAL HIGH (ref 70–99)
Potassium: 3.6 mmol/L (ref 3.5–5.1)
Sodium: 133 mmol/L — ABNORMAL LOW (ref 135–145)
Total Bilirubin: 0.5 mg/dL (ref 0.3–1.2)
Total Protein: 7.9 g/dL (ref 6.5–8.1)

## 2022-06-09 LAB — CBC
HCT: 44.9 % (ref 39.0–52.0)
Hemoglobin: 15.3 g/dL (ref 13.0–17.0)
MCH: 31.4 pg (ref 26.0–34.0)
MCHC: 34.1 g/dL (ref 30.0–36.0)
MCV: 92 fL (ref 80.0–100.0)
Platelets: 191 10*3/uL (ref 150–400)
RBC: 4.88 MIL/uL (ref 4.22–5.81)
RDW: 12.3 % (ref 11.5–15.5)
WBC: 6.3 10*3/uL (ref 4.0–10.5)
nRBC: 0 % (ref 0.0–0.2)

## 2022-06-09 LAB — LIPASE, BLOOD: Lipase: 38 U/L (ref 11–51)

## 2022-06-09 LAB — RESP PANEL BY RT-PCR (RSV, FLU A&B, COVID)  RVPGX2
Influenza A by PCR: NEGATIVE
Influenza B by PCR: POSITIVE — AB
Resp Syncytial Virus by PCR: NEGATIVE
SARS Coronavirus 2 by RT PCR: NEGATIVE

## 2022-06-09 MED ORDER — ONDANSETRON 4 MG PO TBDP: 4.0000 mg | ORAL_TABLET | Freq: Three times a day (TID) | ORAL | 0 refills | Status: DC | PRN

## 2022-06-09 MED ORDER — SODIUM CHLORIDE 0.9 % IV BOLUS
1000.0000 mL | Freq: Once | INTRAVENOUS | Status: AC
  Administered 2022-06-09: 1000 mL via INTRAVENOUS

## 2022-06-09 MED ORDER — DROPERIDOL 2.5 MG/ML IJ SOLN
2.5000 mg | Freq: Once | INTRAMUSCULAR | Status: AC
  Administered 2022-06-09: 2.5 mg via INTRAMUSCULAR
  Filled 2022-06-09: qty 2

## 2022-06-09 NOTE — ED Notes (Signed)
Attempted with [place] IV, unable to cannulate after access, vessel rupture. Unable to obtain blood, Long EDP advised and will evaluate.

## 2022-06-09 NOTE — ED Provider Notes (Signed)
Emergency Department Provider Note   I have reviewed the triage vital signs and the nursing notes.   HISTORY  Chief Complaint Emesis   HPI Charles Fritz is a 31 y.o. male past history reviewed below presents to the emergency department with multiple symptoms including congestion, cough, shortness of breath, abdominal pain, vomiting.  He has history of vomiting episodes, one of which required admit for acute kidney injury in September.  He uses marijuana daily.  Denies alcohol or other drug use.  He states that in addition to the symptoms he has developed some congestion and cough along with shortness of breath.  He was seen in the ED 2 days ago in the [facility] system but ultimately left after he was catheterized for a urine specimen.  He has been managing symptoms at home as best he can but when things progressed in the past 48 hours he presents for reevaluation.  No active chest discomfort or pleuritic pain. No diarrhea.    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  Marijuana occasionally   Kidney failure, acute (HCC)    Kidney stone    Malingering     Review of Systems  Constitutional: No fever/chills Eyes: No visual changes. ENT: No sore throat. Positive congestion.  Cardiovascular: Denies chest pain. Respiratory: Denies shortness of breath. Gastrointestinal: Positive abdominal pain. Positive nausea and vomiting.  No diarrhea.  No constipation. Genitourinary: Negative for dysuria. Musculoskeletal: Negative for back pain. Skin: Negative for rash. Neurological: Negative for headaches, focal weakness or numbness.   ____________________________________________   PHYSICAL EXAM:  VITAL SIGNS: ED Triage Vitals  Enc Vitals Group     BP 06/09/22 1021 (!) 150/110     Pulse Rate 06/09/22 1021 (!) 117     Resp 06/09/22 1021 18     Temp 06/09/22 1021 98.2 F  (36.8 C)     Temp Source 06/09/22 1021 Oral     SpO2 06/09/22 1021 99 %     Weight 06/09/22 1022 246 lb 4.1 oz (111.7 kg)   Constitutional: Alert and oriented. Well appearing and in no acute distress. Eyes: Conjunctivae are normal.  Head: Atraumatic. Nose: No congestion/rhinnorhea. Mouth/Throat: Mucous membranes are slightly dry.  Neck: No stridor.  Cardiovascular: Tachycardia. Good peripheral circulation. Grossly normal heart sounds.   Respiratory: Normal respiratory effort.  No retractions. Lungs CTAB. Gastrointestinal: Soft and nontender. No distention.  Musculoskeletal: No gross deformities of extremities. Neurologic:  Normal speech and language.  Skin:  Skin is warm, dry and intact. No rash noted.  ____________________________________________   LABS (all labs ordered are listed, but only abnormal results are displayed)  Labs Reviewed  RESP PANEL BY RT-PCR (RSV, FLU A&B, COVID)  RVPGX2 - Abnormal; Notable for the following components:      Result Value   Influenza B by PCR POSITIVE (*)    All other components within normal limits  COMPREHENSIVE METABOLIC PANEL - Abnormal; Notable for the following components:   Sodium 133 (*)    Glucose, Bld 101 (*)    Calcium 8.5 (*)    AST 111 (*)    ALT 161 (*)    All other components within normal limits  LIPASE, BLOOD  CBC   ____________________________________________  EKG   EKG Interpretation  Date/Time:  Saturday June 09 2022 10:27:49 EST Ventricular Rate:  110 PR Interval:  148 QRS Duration: 96  QT Interval:  330 QTC Calculation: 446 R Axis:   67 Text Interpretation: Sinus tachycardia Otherwise normal ECG When compared with ECG of 07-May-2022 11:57, PREVIOUS ECG IS PRESENT Confirmed by Nanda Quinton [phone]) on 06/09/2022 10:44:42 AM        ____________________________________________  RADIOLOGY  DG Chest 2 View  Result Date: 06/09/2022 CLINICAL DATA:  Shortness of breath. EXAM: CHEST - 2 VIEW COMPARISON:   None Available. FINDINGS: The heart size and mediastinal contours are within normal limits. Both lungs are clear. The visualized skeletal structures are unremarkable. IMPRESSION: No active cardiopulmonary disease. Electronically Signed   By: Dorise Bullion III M.D.   On: 06/09/2022 11:51    ____________________________________________   PROCEDURES  Procedure(s) performed:   Procedures  Emergency Ultrasound Study:   Angiocath insertion Performed by: Margette Fast  Consent: Verbal consent obtained. Risks and benefits: risks, benefits and alternatives were discussed Immediately prior to procedure the correct patient, procedure, equipment, support staff and site/side marked as needed.  Indication: difficult IV access Preparation: Patient was prepped and draped in the usual sterile fashion. Vein Location: Right AC vein was visualized during assessment for potential access sites and was found to be patent/ easily compressed with linear ultrasound.  The needle was visualized with real-time ultrasound and guided into the vein. Gauge: 20  Normal blood return.  Patient tolerance: Patient tolerated the procedure well with no immediate complications.    ____________________________________________   INITIAL IMPRESSION / ASSESSMENT AND PLAN / ED COURSE  Pertinent labs & imaging results that were available during my care of the patient were reviewed by me and considered in my medical decision making (see chart for details).   This patient is Presenting for Evaluation of abdominal pain, which does require a range of treatment options, and is a complaint that involves a high risk of morbidity and mortality.  The Differential Diagnoses includes but is not exclusive to acute cholecystitis, intrathoracic causes for epigastric abdominal pain, gastritis, duodenitis, pancreatitis, small bowel or large bowel obstruction, abdominal aortic aneurysm, hernia, gastritis, etc.   Critical Interventions-     Medications  droperidol (INAPSINE) 2.5 MG/ML injection 2.5 mg (2.5 mg Intramuscular Given 06/09/22 1210)  sodium chloride 0.9 % bolus 1,000 mL (0 mLs Intravenous Stopped 06/09/22 1355)    Reassessment after intervention: Symptoms improved.    I did obtain Additional Historical Information from fiance at bedside.  I decided to review pertinent External Data, and in summary patient with labs and ED visit in the [facility] system 2 days prior. No AKI or electrolyte disturbance at that time.   Clinical Laboratory Tests Ordered, included Influenza B positive.  No electrolyte disturbance.  LFTs mildly elevated consistent with prior values but normal bilirubin.  Lipase normal.  No leukocytosis.  Radiologic Tests Ordered, included CXR. I independently interpreted the images and agree with radiology interpretation.   Cardiac Monitor Tracing which shows NSR.   Social Determinants of Health Risk positive smoking history.   Medical Decision Making: Summary:  Patient presents to the emergency department with shortness of breath, congestion, abdominal pain, vomiting.  He is positive for flu B.  No hypoxemia, altered mental status.  Chest x-ray shows no pneumonia.  Reevaluation with update and discussion with patient.  Feeling much better after IV fluids and nausea medication.  No increased work of breathing or hypoxemia.   Considered admission but patient feeling much improved after IV fluids and nausea medication.  Testing positive for flu here.  No hypoxemia or increased  work of breathing.  No infiltrate on chest x-ray.  Discussed tricked ED return precaution but appears stable for discharge.  Patient's presentation is most consistent with acute presentation with potential threat to life or bodily function.   Disposition: discharge  ____________________________________________  FINAL CLINICAL IMPRESSION(S) / ED DIAGNOSES  Final diagnoses:  Influenza B  Nausea and vomiting, unspecified  vomiting type  Epigastric pain     NEW OUTPATIENT MEDICATIONS STARTED DURING THIS VISIT:  Discharge Medication List as of 06/09/2022  1:24 PM     START taking these medications   Details  ondansetron (ZOFRAN-ODT) 4 MG disintegrating tablet Take 1 tablet (4 mg total) by mouth every 8 (eight) hours as needed., Starting Sat 06/09/2022, Normal        Note:  This document was prepared using Dragon voice recognition software and may include unintentional dictation errors.  Nanda Quinton, MD, [facility] Emergency Medicine    Elianys Conry, Wonda Olds, MD 06/10/22 [phone]

## 2022-06-09 NOTE — ED Triage Notes (Signed)
Pt arrives pov, endorses abdominal pain, n/v/d and congestion with shob x 2 days. Endorses fever. Left AMA from [facility] x 2 days pta

## 2022-06-09 NOTE — ED Notes (Signed)
Unsuccessful lab draw in triage. Pt dehydrated and vomiting. May need ultrasound IV. Consulting civil engineer notified.

## 2022-06-09 NOTE — ED Notes (Signed)
Pulled another Green top for hemolyzed sample.

## 2022-06-09 NOTE — Discharge Instructions (Signed)
You were seen in the emergency room today with epigastric abdominal pain, vomiting, shortness of breath, congestion.  You have tested positive for influenza.  You should continue to manage your symptoms at home with plenty of fluids, Tylenol/ibuprofen as needed for fever/body aches, and I have called in some nausea medicine to your pharmacy.  This dissolves under your tongue see do not have to swallow it.  You are contagious and should maintain distance from others and wear a mask when around other people.

## 2022-06-09 NOTE — ED Notes (Signed)
Patient transported to X-ray 

## 2022-08-01 ENCOUNTER — Other Ambulatory Visit: Payer: Self-pay

## 2022-08-01 ENCOUNTER — Emergency Department (HOSPITAL_BASED_OUTPATIENT_CLINIC_OR_DEPARTMENT_OTHER)
Admission: EM | Admit: 2022-08-01 | Discharge: 2022-08-01 | Disposition: A | Discharge status: Home / Self Care | Payer: Self-pay | Attending: Emergency Medicine | Admitting: Emergency Medicine

## 2022-08-01 ENCOUNTER — Emergency Department (HOSPITAL_BASED_OUTPATIENT_CLINIC_OR_DEPARTMENT_OTHER): Payer: Self-pay

## 2022-08-01 ENCOUNTER — Encounter (HOSPITAL_BASED_OUTPATIENT_CLINIC_OR_DEPARTMENT_OTHER): Payer: Self-pay | Admitting: Pediatrics

## 2022-08-01 DIAGNOSIS — R1031 Right lower quadrant pain: Secondary | ICD-10-CM | POA: Insufficient documentation

## 2022-08-01 LAB — COMPREHENSIVE METABOLIC PANEL
ALT: 70 U/L — ABNORMAL HIGH (ref 0–44)
AST: 43 U/L — ABNORMAL HIGH (ref 15–41)
Albumin: 4.3 g/dL (ref 3.5–5.0)
Alkaline Phosphatase: 96 U/L (ref 38–126)
Anion gap: 7 (ref 5–15)
BUN: 19 mg/dL (ref 6–20)
CO2: 23 mmol/L (ref 22–32)
Calcium: 8.8 mg/dL — ABNORMAL LOW (ref 8.9–10.3)
Chloride: 106 mmol/L (ref 98–111)
Creatinine, Ser: 0.97 mg/dL (ref 0.61–1.24)
GFR, Estimated: >60 mL/min (ref >60)
Glucose, Bld: 133 mg/dL — ABNORMAL HIGH (ref 70–99)
Potassium: 3.8 mmol/L (ref 3.5–5.1)
Sodium: 136 mmol/L (ref 135–145)
Total Bilirubin: 0.6 mg/dL (ref 0.3–1.2)
Total Protein: 8 g/dL (ref 6.5–8.1)

## 2022-08-01 LAB — URINALYSIS, ROUTINE W REFLEX MICROSCOPIC
Bilirubin Urine: NEGATIVE
Glucose, UA: NEGATIVE mg/dL
Hgb urine dipstick: NEGATIVE
Ketones, ur: NEGATIVE mg/dL
Leukocytes,Ua: NEGATIVE
Nitrite: NEGATIVE
Protein, ur: NEGATIVE mg/dL
Specific Gravity, Urine: 1.02 (ref 1.005–1.030)
pH: 7 (ref 5.0–8.0)

## 2022-08-01 LAB — CBC
HCT: 41.8 % (ref 39.0–52.0)
Hemoglobin: 14.3 g/dL (ref 13.0–17.0)
MCH: 31.2 pg (ref 26.0–34.0)
MCHC: 34.2 g/dL (ref 30.0–36.0)
MCV: 91.3 fL (ref 80.0–100.0)
Platelets: 226 10*3/uL (ref 150–400)
RBC: 4.58 MIL/uL (ref 4.22–5.81)
RDW: 12.2 % (ref 11.5–15.5)
WBC: 10.7 10*3/uL — ABNORMAL HIGH (ref 4.0–10.5)
nRBC: 0 % (ref 0.0–0.2)

## 2022-08-01 LAB — LIPASE, BLOOD: Lipase: 31 U/L (ref 11–51)

## 2022-08-01 MED ORDER — IOHEXOL 300 MG/ML  SOLN
125.0000 mL | Freq: Once | INTRAMUSCULAR | Status: AC | PRN
  Administered 2022-08-01: 125 mL via INTRAVENOUS

## 2022-08-01 MED ORDER — SODIUM CHLORIDE 0.9 % IV BOLUS
500.0000 mL | Freq: Once | INTRAVENOUS | Status: AC
  Administered 2022-08-01: 500 mL via INTRAVENOUS

## 2022-08-01 MED ORDER — FENTANYL CITRATE PF 50 MCG/ML IJ SOSY
50.0000 ug | PREFILLED_SYRINGE | Freq: Once | INTRAMUSCULAR | Status: AC
  Administered 2022-08-01: 50 ug via INTRAVENOUS
  Filled 2022-08-01: qty 1

## 2022-08-01 MED ORDER — IBUPROFEN 800 MG PO TABS: 800.0000 mg | ORAL_TABLET | Freq: Three times a day (TID) | ORAL | 0 refills | Status: AC | PRN

## 2022-08-01 NOTE — ED Provider Notes (Signed)
[place] EMERGENCY DEPARTMENT AT [place] Provider Note   CSN: [id number] Arrival date & time: 08/01/22  1756     History {Add pertinent medical, surgical, social history, OB history to HPI:1} Chief Complaint  Patient presents with   Abdominal Pain    Charles Fritz is a 32 y.o. male.  He has a history of hepatitis and liver disease.  He is complaining of acute onset of right lower quadrant pain that occurred about an hour prior to arrival tonight.  He said he does a lot of heavy lifting at work.  It is not associated with any nausea vomiting diarrhea constipation or urinary symptoms.  It is worse with palpation and movement.  He has tried nothing for it.  He denies prior history of similar pain.  He said when he takes a deep breath it makes his abdomen hurt worse.  He does not feel short of breath.  The history is provided by the patient.  Abdominal Pain Pain location:  RLQ Pain quality: stabbing   Pain severity:  Moderate Onset quality:  Sudden Duration:  2 hours Progression:  Unchanged Chronicity:  New Context: not trauma   Relieved by:  Nothing Worsened by:  Deep breathing and movement Ineffective treatments:  None tried Associated symptoms: no chest pain, no constipation, no cough, no diarrhea, no dysuria, no fever, no hematemesis, no hematochezia, no hematuria, no nausea, no shortness of breath, no sore throat and no vomiting        Home Medications Prior to Admission medications   Medication Sig Start Date End Date Taking? Authorizing Provider  ondansetron (ZOFRAN-ODT) 4 MG disintegrating tablet Take 1 tablet (4 mg total) by mouth every 8 (eight) hours as needed. 06/09/22   Long, Wonda Olds, MD  oxyCODONE (ROXICODONE) 5 MG immediate release tablet Take 1 tablet (5 mg total) by mouth every 4 (four) hours as needed for severe pain. 11/26/21   Henderly, Britni A, PA-C      Allergies    Hydrocodone    Review of Systems   Review of Systems   Constitutional:  Negative for fever.  HENT:  Negative for sore throat.   Respiratory:  Negative for cough and shortness of breath.   Cardiovascular:  Negative for chest pain.  Gastrointestinal:  Positive for abdominal pain. Negative for constipation, diarrhea, hematemesis, hematochezia, nausea and vomiting.  Genitourinary:  Negative for dysuria and hematuria.  Skin:  Negative for rash.    Physical Exam Updated Vital Signs BP 130/78   Pulse 87   Temp 97.8 F (36.6 C) (Oral)   Resp 18   Ht 5\' 11"  (1.803 m)   Wt 120.2 kg   SpO2 97%   BMI 36.96 kg/m  Physical Exam Vitals and nursing note reviewed.  Constitutional:      General: He is not in acute distress.    Appearance: Normal appearance. He is well-developed.  HENT:     Head: Normocephalic and atraumatic.  Eyes:     Conjunctiva/sclera: Conjunctivae normal.  Cardiovascular:     Rate and Rhythm: Normal rate and regular rhythm.     Heart sounds: No murmur heard. Pulmonary:     Effort: Pulmonary effort is normal. No respiratory distress.     Breath sounds: Normal breath sounds.  Abdominal:     Palpations: Abdomen is soft.     Tenderness: There is abdominal tenderness in the right lower quadrant. There is no guarding or rebound.  Musculoskeletal:  General: No swelling.     Cervical back: Neck supple.  Skin:    General: Skin is warm and dry.     Capillary Refill: Capillary refill takes less than 2 seconds.  Neurological:     General: No focal deficit present.     Mental Status: He is alert.     ED Results / Procedures / Treatments   Labs (all labs ordered are listed, but only abnormal results are displayed) Labs Reviewed  COMPREHENSIVE METABOLIC PANEL - Abnormal; Notable for the following components:      Result Value   Glucose, Bld 133 (*)    Calcium 8.8 (*)    AST 43 (*)    ALT 70 (*)    All other components within normal limits  CBC - Abnormal; Notable for the following components:   WBC 10.7 (*)     All other components within normal limits  LIPASE, BLOOD  URINALYSIS, ROUTINE W REFLEX MICROSCOPIC    EKG None  Radiology No results found.  Procedures Procedures  {Document cardiac monitor, telemetry assessment procedure when appropriate:1}  Medications Ordered in ED Medications  sodium chloride 0.9 % bolus 500 mL (has no administration in time range)  fentaNYL (SUBLIMAZE) injection 50 mcg (has no administration in time range)    ED Course/ Medical Decision Making/ A&P   {   Click here for ABCD2, HEART and other calculatorsREFRESH Note before signing :1}                          Medical Decision Making Amount and/or Complexity of Data Reviewed Labs: ordered. Radiology: ordered.  Risk Prescription drug management.   This patient complains of ***; this involves an extensive number of treatment Options and is a complaint that carries with it a high risk of complications and morbidity. The differential includes ***  I ordered, reviewed and interpreted labs, which included *** I ordered medication *** and reviewed PMP when indicated. I ordered imaging studies which included *** and I independently    visualized and interpreted imaging which showed *** Additional history obtained from *** Previous records obtained and reviewed *** I consulted *** and discussed lab and imaging findings and discussed disposition.  Cardiac monitoring reviewed, *** Social determinants considered, *** Critical Interventions: ***  After the interventions stated above, I reevaluated the patient and found *** Admission and further testing considered, ***   {Document critical care time when appropriate:1} {Document review of labs and clinical decision tools ie heart score, Chads2Vasc2 etc:1}  {Document your independent review of radiology images, and any outside records:1} {Document your discussion with family members, caretakers, and with consultants:1} {Document social determinants of  health affecting pt's care:1} {Document your decision making why or why not admission, treatments were needed:1} Final Clinical Impression(s) / ED Diagnoses Final diagnoses:  None    Rx / DC Orders ED Discharge Orders     None

## 2022-08-01 NOTE — Discharge Instructions (Signed)
You are seen in the emergency department for right lower quadrant abdominal pain.  You had blood work urinalysis and a CAT scan that did not show an obvious explanation for your symptoms.  Your CAT scan did show some fatty liver and this will need follow-up with your primary care doctor.  We are prescribing some ibuprofen for pain.  Return to the emergency department if any worsening or concerning symptoms

## 2022-08-01 NOTE — ED Triage Notes (Signed)
C/O RLQ pain started suddenly an hour ago. Reports " when I take deep breathes it seems like someone's stabbing my stomach".

## 2022-08-02 ENCOUNTER — Encounter (HOSPITAL_COMMUNITY): Payer: Self-pay

## 2022-08-02 ENCOUNTER — Emergency Department (HOSPITAL_COMMUNITY)
Admission: EM | Admit: 2022-08-02 | Discharge: 2022-08-02 | Discharge status: Against Medical Advice | Payer: Self-pay | Attending: Emergency Medicine | Admitting: Emergency Medicine

## 2022-08-02 ENCOUNTER — Other Ambulatory Visit: Payer: Self-pay

## 2022-08-02 DIAGNOSIS — R000 Tachycardia, unspecified: Secondary | ICD-10-CM | POA: Insufficient documentation

## 2022-08-02 DIAGNOSIS — Z5321 Procedure and treatment not carried out due to patient leaving prior to being seen by health care provider: Secondary | ICD-10-CM

## 2022-08-02 DIAGNOSIS — R61 Generalized hyperhidrosis: Secondary | ICD-10-CM | POA: Insufficient documentation

## 2022-08-02 DIAGNOSIS — Z5329 Procedure and treatment not carried out because of patient's decision for other reasons: Secondary | ICD-10-CM | POA: Insufficient documentation

## 2022-08-02 DIAGNOSIS — R1031 Right lower quadrant pain: Secondary | ICD-10-CM | POA: Insufficient documentation

## 2022-08-02 LAB — COMPREHENSIVE METABOLIC PANEL
ALT: 68 U/L — ABNORMAL HIGH (ref 0–44)
AST: 41 U/L (ref 15–41)
Albumin: 4.2 g/dL (ref 3.5–5.0)
Alkaline Phosphatase: 87 U/L (ref 38–126)
Anion gap: 13 (ref 5–15)
BUN: 19 mg/dL (ref 6–20)
CO2: 21 mmol/L — ABNORMAL LOW (ref 22–32)
Calcium: 9.3 mg/dL (ref 8.9–10.3)
Chloride: 104 mmol/L (ref 98–111)
Creatinine, Ser: 0.9 mg/dL (ref 0.61–1.24)
GFR, Estimated: >60 mL/min (ref >60)
Glucose, Bld: 117 mg/dL — ABNORMAL HIGH (ref 70–99)
Potassium: 3.6 mmol/L (ref 3.5–5.1)
Sodium: 138 mmol/L (ref 135–145)
Total Bilirubin: 0.3 mg/dL (ref 0.3–1.2)
Total Protein: 7 g/dL (ref 6.5–8.1)

## 2022-08-02 LAB — URINALYSIS, ROUTINE W REFLEX MICROSCOPIC
Bilirubin Urine: NEGATIVE
Glucose, UA: NEGATIVE mg/dL
Hgb urine dipstick: NEGATIVE
Ketones, ur: NEGATIVE mg/dL
Leukocytes,Ua: NEGATIVE
Nitrite: NEGATIVE
Protein, ur: NEGATIVE mg/dL
Specific Gravity, Urine: 1.034 — ABNORMAL HIGH (ref 1.005–1.030)
pH: 5 (ref 5.0–8.0)

## 2022-08-02 LAB — CBC
HCT: 42.8 % (ref 39.0–52.0)
Hemoglobin: 14.5 g/dL (ref 13.0–17.0)
MCH: 31.3 pg (ref 26.0–34.0)
MCHC: 33.9 g/dL (ref 30.0–36.0)
MCV: 92.2 fL (ref 80.0–100.0)
Platelets: 233 10*3/uL (ref 150–400)
RBC: 4.64 MIL/uL (ref 4.22–5.81)
RDW: 12.2 % (ref 11.5–15.5)
WBC: 10.4 10*3/uL (ref 4.0–10.5)
nRBC: 0 % (ref 0.0–0.2)

## 2022-08-02 LAB — LIPASE, BLOOD: Lipase: 34 U/L (ref 11–51)

## 2022-08-02 MED ORDER — MORPHINE SULFATE (PF) 4 MG/ML IV SOLN: 4.0000 mg | Freq: Once | INTRAVENOUS | Status: DC

## 2022-08-02 NOTE — ED Triage Notes (Addendum)
Pt came in d/t RLQ abd pain that began yesterday, denies n/v, has not been able to eat d/t the pain & adds that he is "a little constipated." Rates pain 6/10 while in triage.

## 2022-08-02 NOTE — ED Provider Triage Note (Signed)
Emergency Medicine Provider Triage Evaluation Note  Charles Fritz , a 31 y.o. male  was evaluated in triage.  Pt complains of RLQ pain, seen last night for the same but the pain is now much worse.  Review of Systems  Positive: RLQ pain Negative: fever  Physical Exam  BP (!) 162/112 (BP Location: Right Arm)   Pulse (!) 109   Temp (!) 97.5 F (36.4 C) (Oral)   Resp 20   Ht 5\' 11"  (1.803 m)   Wt 120.2 kg   SpO2 97%   BMI 36.96 kg/m  Gen:   Awake, no distress, uncomfortable Resp:  Normal effort  MSK:   Moves extremities without difficulty  Other:  RLQ tenderness, with positive Rosving's sign  Medical Decision Making  Medically screening exam initiated at 6:36 PM.  Appropriate orders placed.  Thoma Benjamine Mola was informed that the remainder of the evaluation will be completed by another provider, this initial triage assessment does not replace that evaluation, and the importance of remaining in the ED until their evaluation is complete.  We discussed concern for appendicitis and likely need for CT.   Gwenevere Abbot, [place] 08/02/22 [phone]

## 2022-08-02 NOTE — ED Provider Notes (Signed)
[facility] Provider Note   CSN: [id number] Arrival date & time: 08/02/22  1809     History  Chief Complaint  Patient presents with   Rt sided abd pain   Constipation    Charles Fritz is a 32 y.o. male with history of hepatitis and liver disease presents with right lower quadrant abdominal pain.   Per chart review patient was seen yesterday complaining of the same pain.  He had labs that showed his chronic elevations in AST/ALT, LFTs were at his baseline, negative UA, and a negative CT scan and was discharged with recommendation for NSAIDs. Pt complains of RLQ pain that acutely worsened today. States last night it was a 3/10, now it is a 8/10, very severe with movement. Denies f/c, but endorses sweating today. No N/V/D, melena/hematochezia. Endorse some constipation. Last BM was yesterday morning and it was hard. Is still passing gas. No urinary sxs, no h/o similar.    Constipation      Home Medications Prior to Admission medications   Medication Sig Start Date End Date Taking? Authorizing Provider  ibuprofen (ADVIL) 800 MG tablet Take 1 tablet (800 mg total) by mouth every 8 (eight) hours as needed. 08/01/22   Hayden Rasmussen, MD  ondansetron (ZOFRAN-ODT) 4 MG disintegrating tablet Take 1 tablet (4 mg total) by mouth every 8 (eight) hours as needed. 06/09/22   Long, Wonda Olds, MD  oxyCODONE (ROXICODONE) 5 MG immediate release tablet Take 1 tablet (5 mg total) by mouth every 4 (four) hours as needed for severe pain. 11/26/21   Henderly, Britni A, PA-C      Allergies    Hydrocodone    Review of Systems   Review of Systems  Gastrointestinal:  Positive for constipation.   Review of systems Negative for f/c.  A 10 point review of systems was performed and is negative unless otherwise reported in HPI.  Physical Exam Updated Vital Signs BP (!) 162/112 (BP Location: Right Arm)   Pulse (!) 109   Temp (!) 97.5 F (36.4 C) (Oral)    Resp 20   Ht 5\' 11"  (1.803 m)   Wt 120.2 kg   SpO2 97%   BMI 36.96 kg/m  Physical Exam General: Very uncomfortable-appearing male, lying in bed. Mildly diaphoretic. HEENT: Sclera anicteric, MMM, trachea midline.  Cardiology: RRR, no murmurs/rubs/gallops. BL radial and DP pulses equal bilaterally.  Resp: Normal respiratory rate and effort. CTAB, no wheezes, rhonchi, crackles.  Abd: RLQ TTTP with guarding and rebound tenderness. Soft, non-distended.  Negative Murphy sign. GU: Deferred. MSK: No peripheral edema or signs of trauma. Extremities without deformity or TTP. No cyanosis or clubbing. Skin: warm, dry. No rashes or lesions. Back: No CVA tenderness Neuro: A&Ox4, CNs II-XII grossly intact. MAEs. Sensation grossly intact.  Psych: Normal mood and affect.   ED Results / Procedures / Treatments   Labs (all labs ordered are listed, but only abnormal results are displayed) Labs Reviewed  COMPREHENSIVE METABOLIC PANEL - Abnormal; Notable for the following components:      Result Value   CO2 21 (*)    Glucose, Bld 117 (*)    ALT 68 (*)    All other components within normal limits  URINALYSIS, ROUTINE W REFLEX MICROSCOPIC - Abnormal; Notable for the following components:   Specific Gravity, Urine 1.034 (*)    All other components within normal limits  LIPASE, BLOOD  CBC    EKG None  Radiology CT Abdomen  Pelvis W Contrast  Result Date: 08/01/2022 CLINICAL DATA:  Right lower quadrant abdominal pain EXAM: CT ABDOMEN AND PELVIS WITH CONTRAST TECHNIQUE: Multidetector CT imaging of the abdomen and pelvis was performed using the standard protocol following bolus administration of intravenous contrast. RADIATION DOSE REDUCTION: This exam was performed according to the departmental dose-optimization program which includes automated exposure control, adjustment of the mA and/or kV according to patient size and/or use of iterative reconstruction technique. CONTRAST:  110mL OMNIPAQUE IOHEXOL  300 MG/ML  SOLN COMPARISON:  02/28/2022 FINDINGS: Lower chest: Lung bases are clear. Hepatobiliary: Prominent diffuse fatty infiltration of the liver. No focal lesions. Gallbladder and bile ducts are unremarkable. Pancreas: Unremarkable. No pancreatic ductal dilatation or surrounding inflammatory changes. Spleen: Normal in size without focal abnormality. Adrenals/Urinary Tract: Adrenal glands are unremarkable. Kidneys are normal, without renal calculi, focal lesion, or hydronephrosis. Bladder is unremarkable. Stomach/Bowel: Stomach, small bowel, and colon are not abnormally distended. No wall thickening or inflammatory changes. The appendix is normal. Vascular/Lymphatic: No significant vascular findings are present. No enlarged abdominal or pelvic lymph nodes. Reproductive: Prostate is unremarkable. Other: No free air or free fluid in the abdomen. Abdominal wall musculature appears intact. Musculoskeletal: No acute or significant osseous findings. IMPRESSION: 1. No acute process demonstrated in the abdomen or pelvis. No evidence of bowel obstruction or inflammation. Appendix is normal. 2. Diffuse fatty infiltration of the liver, similar to prior study. Electronically Signed   By: Lucienne Capers M.D.   On: 08/01/2022 20:35    Procedures Procedures    Medications Ordered in ED Medications  morphine (PF) 4 MG/ML injection 4 mg (has no administration in time range)    ED Course/ Medical Decision Making/ A&P                          Medical Decision Making Amount and/or Complexity of Data Reviewed Labs: ordered. Decision-making details documented in ED Course. Radiology: ordered.  Risk Prescription drug management.    This patient presents to the ED for concern of acutely worsened abdominal pain, this involves an extensive number of treatment options, and is a complaint that carries with it a high risk of complications and morbidity.  I considered the following differential and admission for  this acute, potentially life threatening condition.   MDM:    CT abd pelvis w contrast completed last night reads: IMPRESSION: 1. No acute process demonstrated in the abdomen or pelvis. No evidence of bowel obstruction or inflammation. Appendix is normal. 2. Diffuse fatty infiltration of the liver, similar to prior study.  Patient with acutely worsened abdominal pain, now does have signs of acute abdomen on exam including guarding and rebound tenderness in the right lower quadrant.  Concern for acute appendicitis, ruptured viscus, cholelithiasis/cholecystitis though negative Murphy sign.  Patient's labs do not demonstrate any leukocytosis today and he is afebrile though he is diaphoretic on exam and mildly tachycardic.  Without any nausea vomiting and he is still passing gas, low concern for small bowel obstruction.  Low concern for pancreatitis given lack of epigastric pain and negative lipase yesterday.  No CVA tenderness to suggest pyelonephritis and no urinary symptoms to suggest UTI.  Consider also diverticulitis though it is on the right side.  Will obtain another CT abdomen pelvis given his signs of acute abdomen today.  Analgesia with morphine, patient made NPO.  Clinical Course as of 08/02/22 2228  Thu Aug 02, 2022  2136 Urinalysis, Routine w reflex microscopic -Urine,  Clean Catch(!) Neg [HN]  2137 WBC: 10.4 [HN]  2137 Lipase: 34 [HN]  2137 Comprehensive metabolic panel(!) Mildly elevated ALT [HN]  2226 Patient eloped from the emergency department. Did not receive imaging. Had not received any pain medication.  [HN]    Clinical Course User Index [HN] Audley Hose, MD    Labs: I Ordered, and personally interpreted labs.  The pertinent results include:  those listed above  Imaging Studies ordered: I ordered imaging studies including CT a/P I independently visualized and interpreted imaging. I agree with the radiologist interpretation  Additional history obtained from chart  review.   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  Marijuana occasionally   Kidney failure, acute ([facility])    Kidney stone    Malingering      Medicines Meds ordered this encounter  Medications   morphine (PF) 4 MG/ML injection 4 mg    I have reviewed the patients home medicines and have made adjustments as needed  Problem List / ED Course: Problem List Items Addressed This Visit   None Visit Diagnoses     Right lower quadrant abdominal pain    -  Primary   Eloped from emergency department                       This note was created using dictation software, which may contain spelling or grammatical errors.    Audley Hose, MD 08/02/22 2228

## 2022-08-02 NOTE — ED Notes (Signed)
Pt noted to ambulate out to the lobby

## 2022-08-29 ENCOUNTER — Emergency Department (HOSPITAL_COMMUNITY)
Admission: EM | Admit: 2022-08-29 | Discharge: 2022-08-29 | Disposition: A | Discharge status: Home / Self Care | Payer: Self-pay | Attending: Emergency Medicine | Admitting: Emergency Medicine

## 2022-08-29 ENCOUNTER — Other Ambulatory Visit: Payer: Self-pay

## 2022-08-29 DIAGNOSIS — R252 Cramp and spasm: Secondary | ICD-10-CM | POA: Insufficient documentation

## 2022-08-29 DIAGNOSIS — R1110 Vomiting, unspecified: Secondary | ICD-10-CM | POA: Insufficient documentation

## 2022-08-29 DIAGNOSIS — R1031 Right lower quadrant pain: Secondary | ICD-10-CM | POA: Insufficient documentation

## 2022-08-29 DIAGNOSIS — Z79899 Other long term (current) drug therapy: Secondary | ICD-10-CM | POA: Insufficient documentation

## 2022-08-29 LAB — CBC WITH DIFFERENTIAL/PLATELET
Abs Immature Granulocytes: 0.04 10*3/uL (ref 0.00–0.07)
Basophils Absolute: 0.1 10*3/uL (ref 0.0–0.1)
Basophils Relative: 1 %
Eosinophils Absolute: 0.3 10*3/uL (ref 0.0–0.5)
Eosinophils Relative: 3 %
HCT: 40.9 % (ref 39.0–52.0)
Hemoglobin: 13.6 g/dL (ref 13.0–17.0)
Immature Granulocytes: 0 %
Lymphocytes Relative: 38 %
Lymphs Abs: 3.5 10*3/uL (ref 0.7–4.0)
MCH: 31 pg (ref 26.0–34.0)
MCHC: 33.3 g/dL (ref 30.0–36.0)
MCV: 93.2 fL (ref 80.0–100.0)
Monocytes Absolute: 0.8 10*3/uL (ref 0.1–1.0)
Monocytes Relative: 9 %
Neutro Abs: 4.6 10*3/uL (ref 1.7–7.7)
Neutrophils Relative %: 49 %
Platelets: 209 10*3/uL (ref 150–400)
RBC: 4.39 MIL/uL (ref 4.22–5.81)
RDW: 12.5 % (ref 11.5–15.5)
WBC: 9.3 10*3/uL (ref 4.0–10.5)
nRBC: 0 % (ref 0.0–0.2)

## 2022-08-29 LAB — COMPREHENSIVE METABOLIC PANEL
ALT: 60 U/L — ABNORMAL HIGH (ref 0–44)
AST: 36 U/L (ref 15–41)
Albumin: 4.4 g/dL (ref 3.5–5.0)
Alkaline Phosphatase: 81 U/L (ref 38–126)
Anion gap: 3 — ABNORMAL LOW (ref 5–15)
BUN: 23 mg/dL — ABNORMAL HIGH (ref 6–20)
CO2: 22 mmol/L (ref 22–32)
Calcium: 8.6 mg/dL — ABNORMAL LOW (ref 8.9–10.3)
Chloride: 110 mmol/L (ref 98–111)
Creatinine, Ser: 0.77 mg/dL (ref 0.61–1.24)
GFR, Estimated: >60 mL/min (ref >60)
Glucose, Bld: 89 mg/dL (ref 70–99)
Potassium: 4 mmol/L (ref 3.5–5.1)
Sodium: 135 mmol/L (ref 135–145)
Total Bilirubin: 0.7 mg/dL (ref 0.3–1.2)
Total Protein: 7.6 g/dL (ref 6.5–8.1)

## 2022-08-29 LAB — URINALYSIS, ROUTINE W REFLEX MICROSCOPIC
Bilirubin Urine: NEGATIVE
Glucose, UA: NEGATIVE mg/dL
Hgb urine dipstick: NEGATIVE
Ketones, ur: NEGATIVE mg/dL
Leukocytes,Ua: NEGATIVE
Nitrite: NEGATIVE
Protein, ur: NEGATIVE mg/dL
Specific Gravity, Urine: 1.025 (ref 1.005–1.030)
pH: 6 (ref 5.0–8.0)

## 2022-08-29 LAB — LIPASE, BLOOD: Lipase: 34 U/L (ref 11–51)

## 2022-08-29 LAB — RAPID URINE DRUG SCREEN, HOSP PERFORMED
Amphetamines: NOT DETECTED
Barbiturates: NOT DETECTED
Benzodiazepines: NOT DETECTED
Cocaine: NOT DETECTED
Opiates: NOT DETECTED
Tetrahydrocannabinol: POSITIVE — AB

## 2022-08-29 LAB — CK: Total CK: 245 U/L (ref 49–397)

## 2022-08-29 MED ORDER — DICYCLOMINE HCL 10 MG/ML IM SOLN
20.0000 mg | Freq: Once | INTRAMUSCULAR | Status: AC
  Administered 2022-08-29: 20 mg via INTRAMUSCULAR
  Filled 2022-08-29: qty 2

## 2022-08-29 MED ORDER — KETOROLAC TROMETHAMINE 30 MG/ML IJ SOLN
30.0000 mg | Freq: Once | INTRAMUSCULAR | Status: AC
  Administered 2022-08-29: 30 mg via INTRAVENOUS
  Filled 2022-08-29: qty 1

## 2022-08-29 MED ORDER — HYOSCYAMINE SULFATE SL 0.125 MG SL SUBL: 0.1250 mg | SUBLINGUAL_TABLET | SUBLINGUAL | 0 refills | Status: AC | PRN

## 2022-08-29 NOTE — ED Triage Notes (Signed)
C/o RLQ abd cramping x1 month that is worse when drinking fluids with n/v.  Pt reports no improvement since 2/8 when was seen for same.

## 2022-08-29 NOTE — Discharge Instructions (Signed)

## 2022-08-29 NOTE — ED Provider Notes (Signed)
[facility] Provider Note   CSN: [id number] Arrival date & time: 08/29/22  1040     History  Chief Complaint  Patient presents with   Abdominal Pain    Charles Fritz is a 32 y.o. male here with RLQ abdominal pain. Seen for the same - one month ago.. CT negative at that time. Provider recommended  Sxs are intermittent. He has had one episode of vomiting today. Unrelieved today. States that the pain is the same.and has not gone away.  Patient has not followed up in the outpatient setting for the same.  He does have a history of kidney stones.  Patient also is complaining of muscle cramps today..   Abdominal Pain      Home Medications Prior to Admission medications   Medication Sig Start Date End Date Taking? Authorizing Provider  ibuprofen (ADVIL) 800 MG tablet Take 1 tablet (800 mg total) by mouth every 8 (eight) hours as needed. 08/01/22   Hayden Rasmussen, MD  ondansetron (ZOFRAN-ODT) 4 MG disintegrating tablet Take 1 tablet (4 mg total) by mouth every 8 (eight) hours as needed. 06/09/22   Long, Wonda Olds, MD  oxyCODONE (ROXICODONE) 5 MG immediate release tablet Take 1 tablet (5 mg total) by mouth every 4 (four) hours as needed for severe pain. 11/26/21   Henderly, Britni A, PA-C      Allergies    Hydrocodone    Review of Systems   Review of Systems  Gastrointestinal:  Positive for abdominal pain.    Physical Exam Updated Vital Signs BP (!) 166/111 (BP Location: Left Arm)   Pulse 97   Temp 97.6 F (36.4 C) (Oral)   Resp 18   SpO2 99%  Physical Exam Vitals and nursing note reviewed.  Constitutional:      General: He is not in acute distress.    Appearance: He is well-developed. He is not diaphoretic.  HENT:     Head: Normocephalic and atraumatic.  Eyes:     General: No scleral icterus.    Conjunctiva/sclera: Conjunctivae normal.  Cardiovascular:     Rate and Rhythm: Normal rate and regular rhythm.     Heart  sounds: Normal heart sounds.  Pulmonary:     Effort: Pulmonary effort is normal. No respiratory distress.     Breath sounds: Normal breath sounds.  Abdominal:     Palpations: Abdomen is soft.     Tenderness: There is generalized abdominal tenderness.  Musculoskeletal:     Cervical back: Normal range of motion and neck supple.  Skin:    General: Skin is warm and dry.  Neurological:     Mental Status: He is alert.  Psychiatric:        Behavior: Behavior normal.     ED Results / Procedures / Treatments   Labs (all labs ordered are listed, but only abnormal results are displayed) Labs Reviewed - No data to display  EKG None  Radiology No results found.  Procedures Procedures    Medications Ordered in ED Medications - No data to display  ED Course/ Medical Decision Making/ A&P Clinical Course as of 08/30/22 1322  Wed Aug 29, 2022  1334 Comprehensive metabolic panel(!) [AH]  1443 CBC with Differential [AH]  1334 Tetrahydrocannabinol(!): POSITIVE [AH]    Clinical Course User Index [AH] Margarita Mail, PA-C  Medical Decision Making 32 year old male who presents emergency department chief complaint of abdominal pain.The differential diagnosis for generalized abdominal pain includes, but is not limited to AAA, gastroenteritis, appendicitis, Bowel obstruction, Bowel perforation. Gastroparesis, DKA, Hernia, Inflammatory bowel disease, mesenteric ischemia, pancreatitis, peritonitis SBP, volvulus.  Patient appears to have recurrent episodes of pain especially in the right lower quadrant.  He had a recent workup with a CT angiogram that showed no kidney stones, no other acute findings. I ordered and reviewed labs including CMP, CBC without elevated white blood cell count or other abnormalities, lipase, CK and urine all within normal limits. Patient's UDS positive for marijuana which she admits to smoking regularly.  After review of patient's lab  values and based on physical examination I do not feel that the patient needs any further imaging at this time.  I do think that he needs to have outpatient follow-up with a GI specialist.  I have given the patient some Toradol and Bentyl with some improvement in his pain.  Suggest healthy bowel habits, PCP follow-up and return precautions discussed.  Patient appears otherwise appropriate for discharge at this time  Amount and/or Complexity of Data Reviewed Labs: ordered. Decision-making details documented in ED Course.  Risk Prescription drug management.            Final Clinical Impression(s) / ED Diagnoses Final diagnoses:  None    Rx / DC Orders ED Discharge Orders     None         Margarita Mail, PA-C 08/30/22 Greene, Ankit, MD 08/31/22 0710

## 2023-01-20 ENCOUNTER — Emergency Department (HOSPITAL_BASED_OUTPATIENT_CLINIC_OR_DEPARTMENT_OTHER): Payer: Self-pay

## 2023-01-20 ENCOUNTER — Encounter (HOSPITAL_BASED_OUTPATIENT_CLINIC_OR_DEPARTMENT_OTHER): Payer: Self-pay | Admitting: Emergency Medicine

## 2023-01-20 ENCOUNTER — Emergency Department (HOSPITAL_BASED_OUTPATIENT_CLINIC_OR_DEPARTMENT_OTHER)
Admission: EM | Admit: 2023-01-20 | Discharge: 2023-01-20 | Disposition: A | Discharge status: Home / Self Care | Payer: Self-pay | Attending: Emergency Medicine | Admitting: Emergency Medicine

## 2023-01-20 ENCOUNTER — Other Ambulatory Visit: Payer: Self-pay

## 2023-01-20 DIAGNOSIS — M542 Cervicalgia: Secondary | ICD-10-CM

## 2023-01-20 DIAGNOSIS — S161XXA Strain of muscle, fascia and tendon at neck level, initial encounter: Secondary | ICD-10-CM | POA: Insufficient documentation

## 2023-01-20 DIAGNOSIS — W208XXA Other cause of strike by thrown, projected or falling object, initial encounter: Secondary | ICD-10-CM | POA: Insufficient documentation

## 2023-01-20 MED ORDER — METHOCARBAMOL 500 MG PO TABS: 500.0000 mg | ORAL_TABLET | Freq: Two times a day (BID) | ORAL | 0 refills | Status: AC

## 2023-01-20 MED ORDER — OXYCODONE-ACETAMINOPHEN 5-325 MG PO TABS
1.0000 | ORAL_TABLET | Freq: Once | ORAL | Status: AC
  Administered 2023-01-20: 1 via ORAL
  Filled 2023-01-20: qty 1

## 2023-01-20 NOTE — Discharge Instructions (Addendum)
CT scan of your neck was reassuring today. Please take tylenol/ibuprofen and robaxin for pain, use heating pads or cold compression for symptom relief. I recommend close follow-up with PCP for reevaluation.  Please do not hesitate to return to emergency department if worrisome signs symptoms we discussed become apparent.

## 2023-01-20 NOTE — ED Triage Notes (Signed)
Pt c/o LT side neck pain; he dropped a chair on his head earlier this week, then yesterday was "chicken fighting" in the pool with kids; difficulty turning head from side to side

## 2023-01-20 NOTE — ED Provider Notes (Signed)
[facility] Provider Note   CSN: [id number] Arrival date & time: 01/20/23  1810     History  Chief Complaint  Patient presents with   Neck Pain    Charles Fritz is a 32 y.o. male history of hepatitis, kidney stones presents today for evaluation of neck pain.  Patient stated he dropped a chair on his head earlier this week and has had neck pain since then.  Yesterday he was playing "chicken fighting" in the pool with kids and later on started to have difficulty turning head from side-to-side.  Denies any trouble with breathing and swallowing.  Denies any headache, dizziness, chest pain, shortness of breath, denies any extremity weakness or numbness.   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  Marijuana occasionally   Kidney failure, acute ([facility])    Kidney stone    Malingering    Past Surgical History:  Procedure Laterality Date   FINGER SURGERY       Home Medications Prior to Admission medications   Medication Sig Start Date End Date Taking? Authorizing Provider  Hyoscyamine Sulfate SL (LEVSIN/SL) 0.125 MG SUBL Place 1 tablet (0.125 mg total) under the tongue every 4 (four) hours as needed (pain). Up to 1.25 mg daily 08/29/22   Arthor Captain, PA-C  ibuprofen (ADVIL) 800 MG tablet Take 1 tablet (800 mg total) by mouth every 8 (eight) hours as needed. Patient taking differently: Take 800 mg by mouth every 8 (eight) hours as needed (for stomach pain). 08/01/22   Terrilee Files, MD  ondansetron (ZOFRAN-ODT) 4 MG disintegrating tablet Take 1 tablet (4 mg total) by mouth every 8 (eight) hours as needed. Patient not taking: Reported on 08/29/2022 06/09/22   Long, Arlyss Repress, MD  oxyCODONE (ROXICODONE) 5 MG immediate release tablet Take 1 tablet (5 mg total) by mouth every 4 (four) hours as needed for severe  pain. Patient not taking: Reported on 08/29/2022 11/26/21   Henderly, Britni A, PA-C      Allergies    Hydrocodone    Review of Systems   Review of Systems  Musculoskeletal:  Positive for neck pain.    Physical Exam Updated Vital Signs BP (!) 158/112 (BP Location: Right Arm)   Pulse 96   Temp 97.7 F (36.5 C) (Oral)   Resp 20   Ht 5\' 11"  (1.803 m)   Wt 122.5 kg   SpO2 98%   BMI 37.66 kg/m  Physical Exam Vitals and nursing note reviewed.  Constitutional:      Appearance: Normal appearance.  HENT:     Head: Normocephalic and atraumatic.     Mouth/Throat:     Mouth: Mucous membranes are moist.  Eyes:     General: No scleral icterus. Neck:     Comments: Patient is able to turn his head side-to-side, up and down with some difficulty.  There was tenderness to palpation to the paraspinal muscle of the cervical spine.  No redness, swelling noted. Cardiovascular:     Rate and Rhythm: Normal rate and regular rhythm.     Pulses: Normal pulses.     Heart sounds: Normal heart sounds.  Pulmonary:     Effort: Pulmonary effort is normal.     Breath sounds: Normal breath sounds.  Abdominal:     General: Abdomen is flat.  Palpations: Abdomen is soft.     Tenderness: There is no abdominal tenderness.  Musculoskeletal:        General: No deformity.  Skin:    General: Skin is warm.     Findings: No rash.  Neurological:     General: No focal deficit present.     Mental Status: He is alert.  Psychiatric:        Mood and Affect: Mood normal.     ED Results / Procedures / Treatments   Labs (all labs ordered are listed, but only abnormal results are displayed) Labs Reviewed - No data to display  EKG None  Radiology CT Cervical Spine Wo Contrast  Result Date: 01/20/2023 CLINICAL DATA:  Trauma to top of head 2 days ago, neck pain EXAM: CT CERVICAL SPINE WITHOUT CONTRAST TECHNIQUE: Multidetector CT imaging of the cervical spine was performed without intravenous contrast.  Multiplanar CT image reconstructions were also generated. RADIATION DOSE REDUCTION: This exam was performed according to the departmental dose-optimization program which includes automated exposure control, adjustment of the mA and/or kV according to patient size and/or use of iterative reconstruction technique. COMPARISON:  10/03/2021 FINDINGS: Alignment: Mild reversal cervical lordosis unchanged since prior exam. Otherwise alignment is anatomic. Skull base and vertebrae: No acute fracture. No primary bone lesion or focal pathologic process. Partial opacification of the right sphenoid sinus. Soft tissues and spinal canal: No prevertebral fluid or swelling. No visible canal hematoma. Disc levels: Mild C5-6 and C6-7 spondylosis unchanged since prior exam. Upper chest: Central airway is patent.  Lung apices are clear. Other: Reconstructed images demonstrate no additional findings. IMPRESSION: 1. No acute cervical spine fracture. 2. Stable mild lower cervical spondylosis. 3. Stable sphenoid sinus disease. Electronically Signed   By: Sharlet Salina M.D.   On: 01/20/2023 19:08    Procedures Procedures    Medications Ordered in ED Medications  oxyCODONE-acetaminophen (PERCOCET/ROXICET) 5-325 MG per tablet 1 tablet (has no administration in time range)    ED Course/ Medical Decision Making/ A&P                             Medical Decision Making Amount and/or Complexity of Data Reviewed Radiology: ordered.  Risk Prescription drug management.   This patient presents to the ED for neck pain, this involves an extensive number of treatment options, and is a complaint that carries with a high risk of complications and morbidity.  The differential diagnosis includes neck strain, radiculopathy, musculoskeletal pain, fracture, dislocation.  This is not an exhaustive list.  Imaging studies: I ordered imaging studies, personally reviewed, interpreted imaging and agree with the radiologist's interpretations.  The results include: CT cervical spine was unremarkable.  Problem list/ ED course/ Critical interventions/ Medical management: HPI: See above Vital signs within normal range and stable throughout visit. Laboratory/imaging studies significant for: See above. On physical examination, patient is afebrile and appears in no acute distress.  There was tenderness to palpation to paraspinal muscles of the cervical spine.  Patient is able to move his head side-to-side, up and down with some difficulty.  He denies any weakness or numbness in his extremities.  Does not have any radiculopathy symptoms.  Based on CT scan results, I have low suspicion for fracture, dislocation or any acute emergency at this point.  Given Percocet here.  Will sent an Rx of Robaxin.  Advised patient to take Tylenol/ibuprofen, Robaxin, use heating pads, cold compressions for symptom relief, follow-up with  primary care physician for reevaluation.  Strict precaution discussed. I have reviewed the patient home medicines and have made adjustments as needed.  Cardiac monitoring/EKG: The patient was maintained on a cardiac monitor.  I personally reviewed and interpreted the cardiac monitor which showed an underlying rhythm of: sinus rhythm.  Additional history obtained: External records from outside source obtained and reviewed including: Chart review including previous notes, labs, imaging.  Consultations obtained:  Disposition Continued outpatient therapy. Follow-up with PCP recommended for reevaluation of symptoms. Treatment plan discussed with patient.  Pt acknowledged understanding was agreeable to the plan. Worrisome signs and symptoms were discussed with patient, and patient acknowledged understanding to return to the ED if they noticed these signs and symptoms. Patient was stable upon discharge.   This chart was dictated using voice recognition software.  Despite best efforts to proofread,  errors can occur which can change the  documentation meaning.          Final Clinical Impression(s) / ED Diagnoses Final diagnoses:  Neck pain  Acute strain of neck muscle, initial encounter    Rx / DC Orders ED Discharge Orders          Ordered    methocarbamol (ROBAXIN) 500 MG tablet  2 times daily        01/20/23 2009              Jeanelle Malling, [place] 01/20/23 2014    Ernie Avena, MD 01/20/23 2132

## 2023-05-12 IMAGING — CR DG CHEST 2V
2 series · 2 of 2 positions shown · non-contrast
Comparison: 08/22/2021

CLINICAL DATA: Short of breath.  Fall yesterday

EXAM:
CHEST - 2 VIEW

[w chest pa]
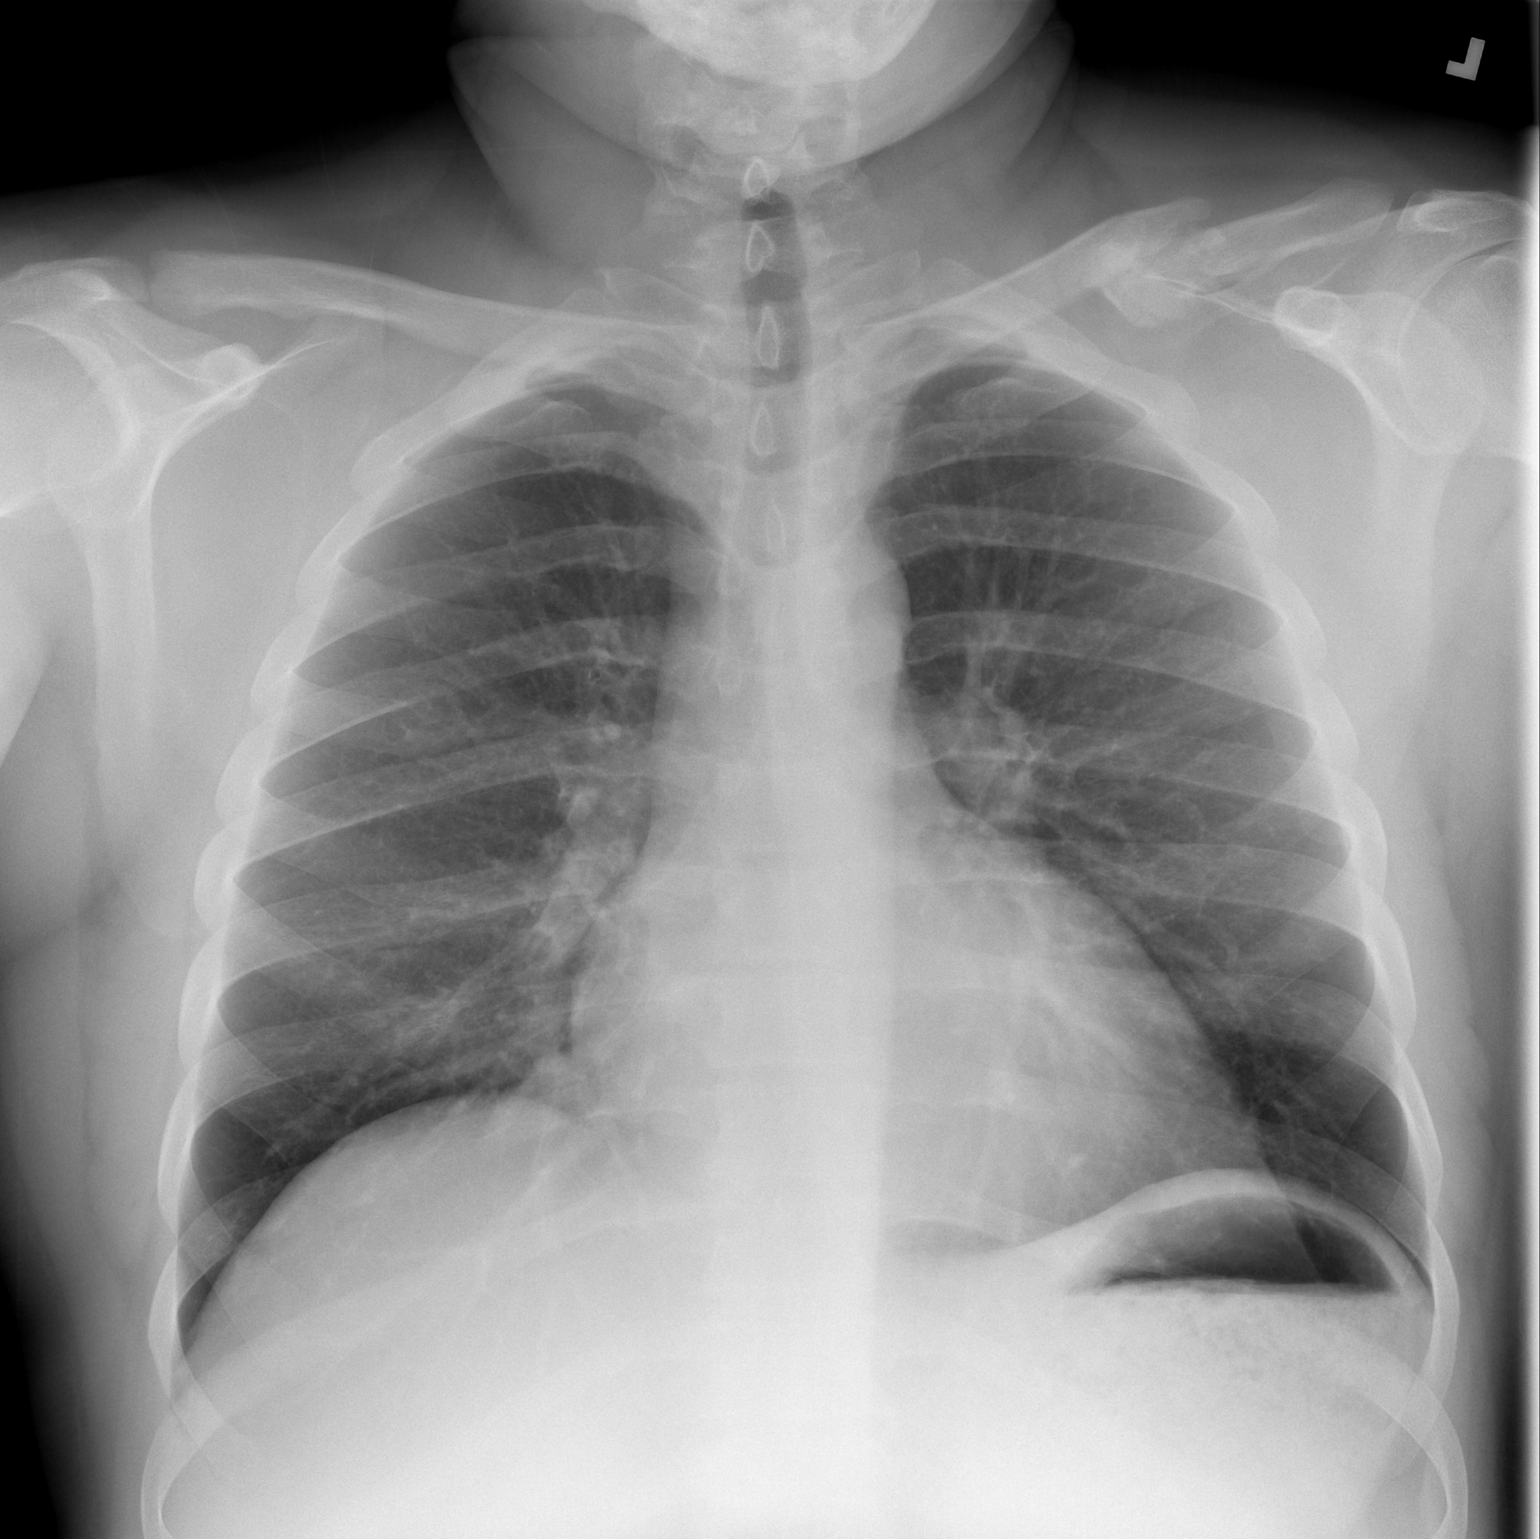

[w chest lat]
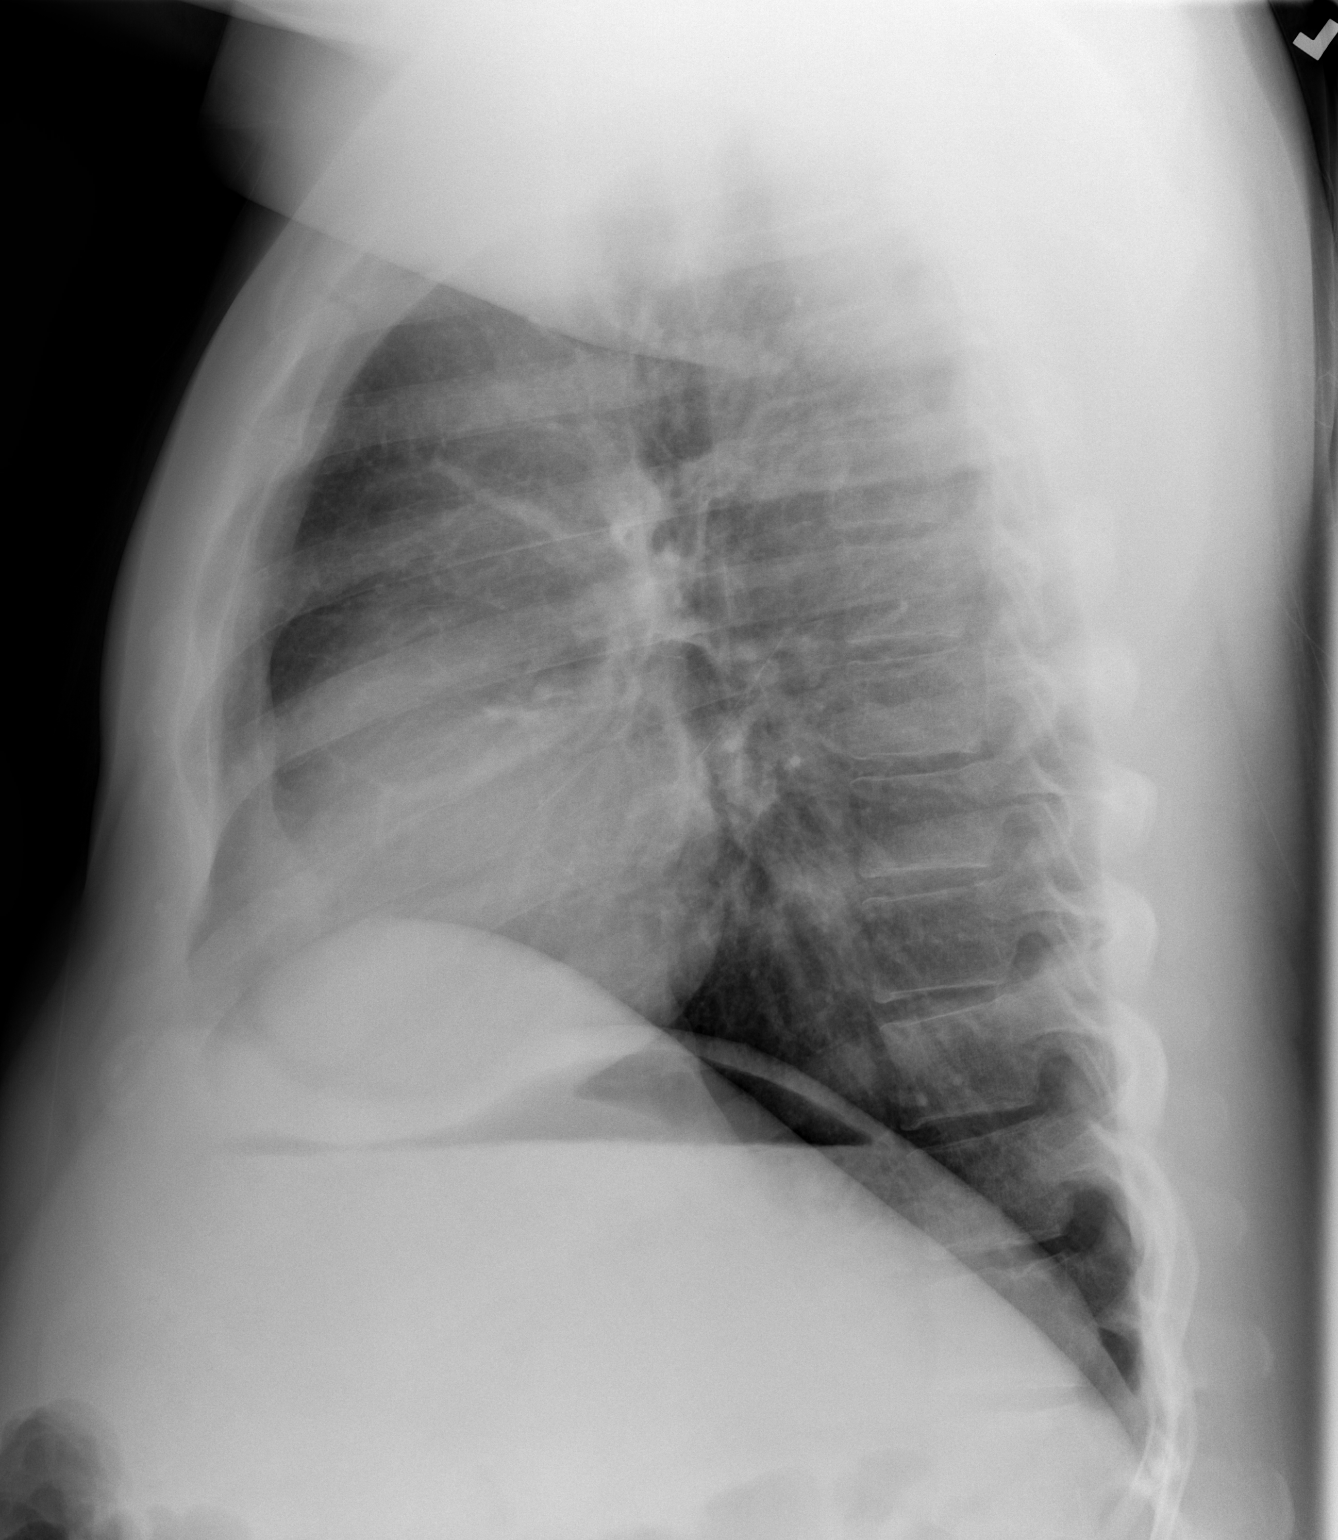

[2 of 2 positions shown; findings below may reference images not displayed]

FINDINGS: Heart size upper normal. Normal vascularity. Lungs clear without
infiltrate or effusion

Chronic fracture left clavicle unchanged.
IMPRESSION: No active cardiopulmonary disease.

## 2023-05-12 IMAGING — DX DG SHOULDER 2+V*R*
3 series · 3 of 3 positions shown · non-contrast
Comparison: None.

CLINICAL DATA: Fall.  Shoulder pain.  Fell off a truck yesterday.

EXAM:
RIGHT SHOULDER - 2+ VIEW

[shoulder grashey]
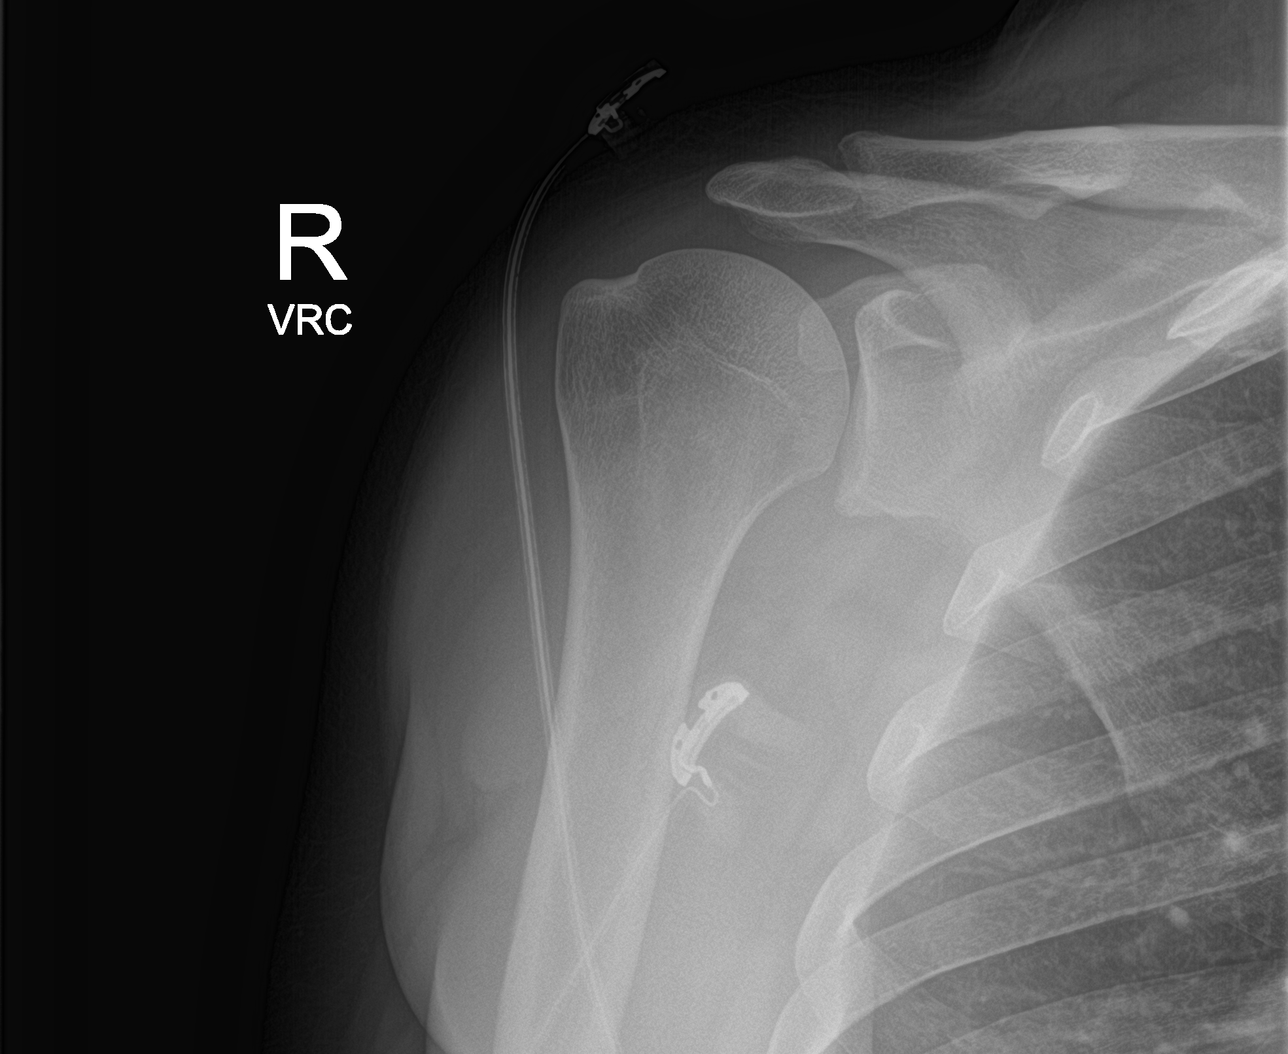

[shoulder y view]
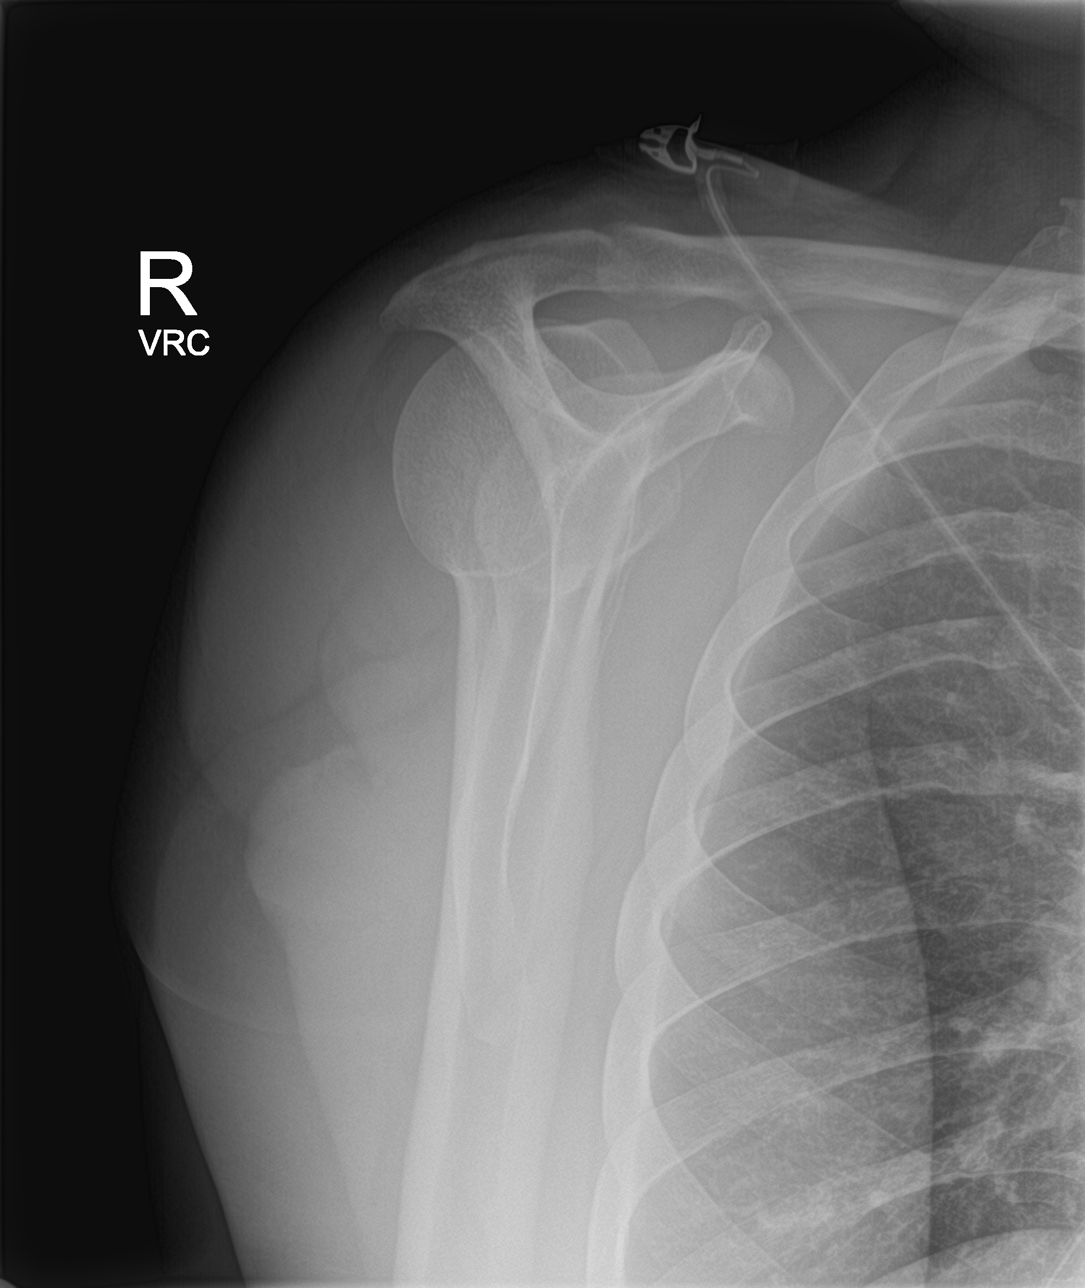

[shoulder axillary]
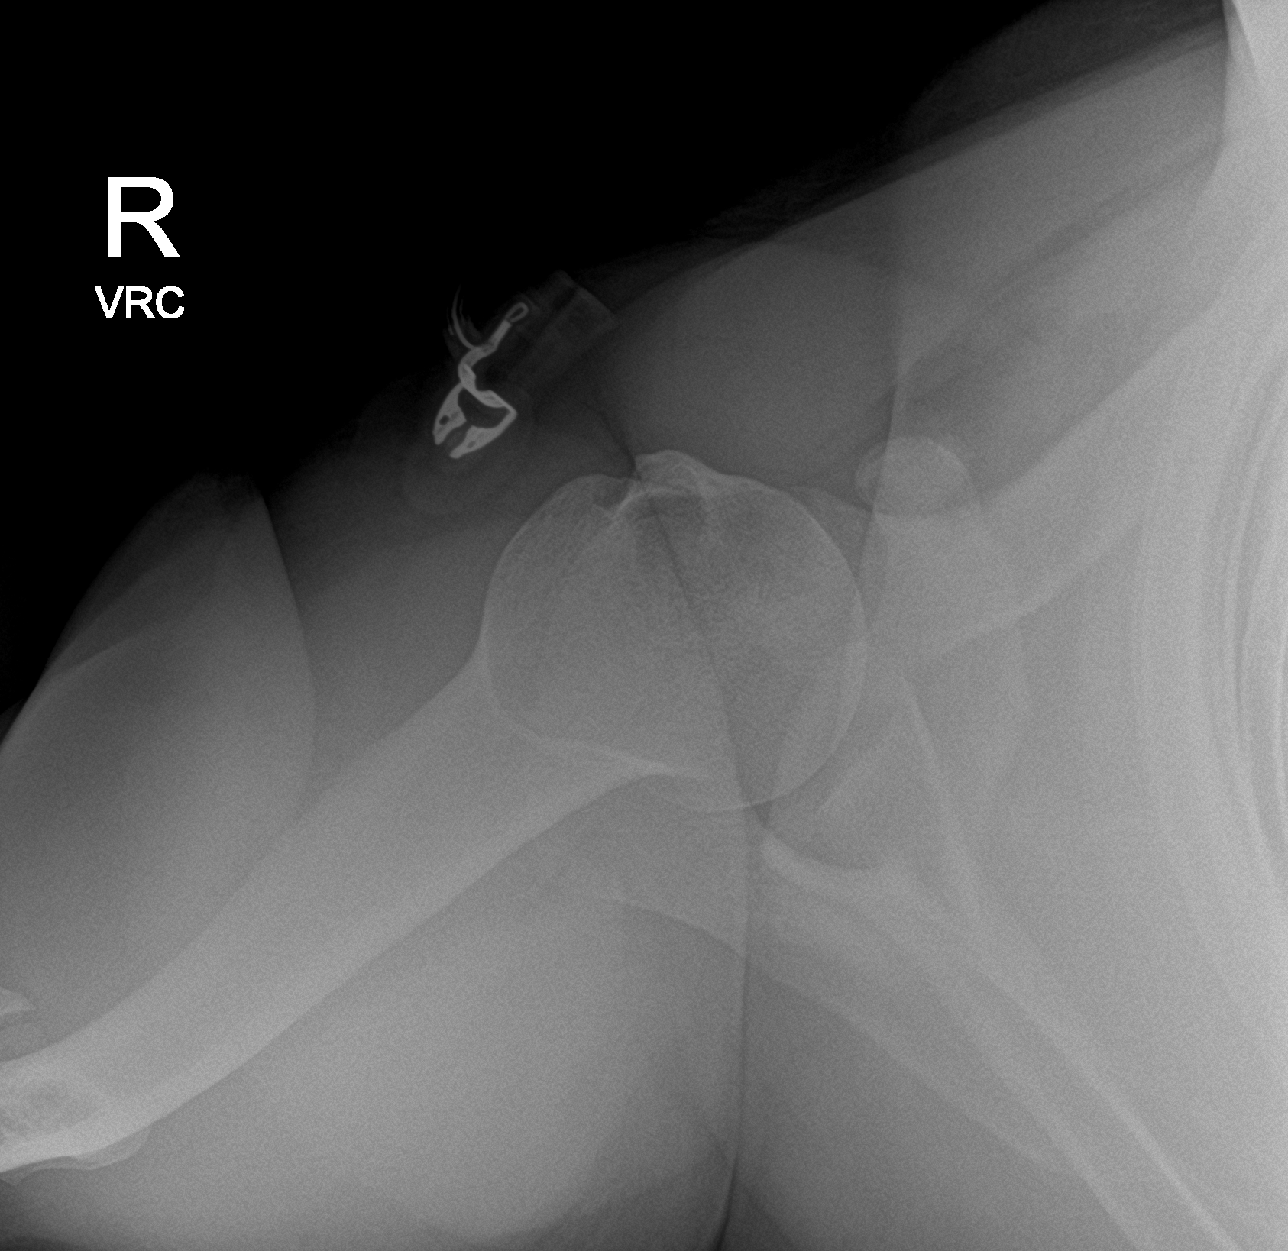

[3 of 3 positions shown; findings below may reference images not displayed]

FINDINGS: Normal bone mineralization. The glenohumeral and acromioclavicular
joints are appropriately aligned. Mild acromioclavicular joint space
narrowing. No acute fracture or dislocation.
IMPRESSION: No acute fracture.

## 2023-06-19 ENCOUNTER — Emergency Department (HOSPITAL_BASED_OUTPATIENT_CLINIC_OR_DEPARTMENT_OTHER)
Admission: EM | Admit: 2023-06-19 | Discharge: 2023-06-19 | Disposition: A | Discharge status: Home / Self Care | Payer: Self-pay | Attending: Emergency Medicine | Admitting: Emergency Medicine

## 2023-06-19 ENCOUNTER — Other Ambulatory Visit: Payer: Self-pay

## 2023-06-19 ENCOUNTER — Encounter (HOSPITAL_BASED_OUTPATIENT_CLINIC_OR_DEPARTMENT_OTHER): Payer: Self-pay | Admitting: Emergency Medicine

## 2023-06-19 DIAGNOSIS — H9202 Otalgia, left ear: Secondary | ICD-10-CM | POA: Diagnosis present

## 2023-06-19 DIAGNOSIS — H9201 Otalgia, right ear: Secondary | ICD-10-CM | POA: Diagnosis not present

## 2023-06-19 DIAGNOSIS — H65192 Other acute nonsuppurative otitis media, left ear: Secondary | ICD-10-CM | POA: Insufficient documentation

## 2023-06-19 DIAGNOSIS — L03213 Periorbital cellulitis: Secondary | ICD-10-CM | POA: Insufficient documentation

## 2023-06-19 DIAGNOSIS — H65191 Other acute nonsuppurative otitis media, right ear: Secondary | ICD-10-CM

## 2023-06-19 MED ORDER — ERYTHROMYCIN 5 MG/GM OP OINT: TOPICAL_OINTMENT | OPHTHALMIC | 0 refills | Status: AC

## 2023-06-19 MED ORDER — TETRACAINE HCL 0.5 % OP SOLN
2.0000 [drp] | Freq: Once | OPHTHALMIC | Status: AC
  Administered 2023-06-19: 2 [drp] via OPHTHALMIC
  Filled 2023-06-19: qty 4

## 2023-06-19 MED ORDER — AMOXICILLIN-POT CLAVULANATE 875-125 MG PO TABS: 1.0000 | ORAL_TABLET | Freq: Two times a day (BID) | ORAL | 0 refills | Status: AC

## 2023-06-19 MED ORDER — FLUORESCEIN SODIUM 1 MG OP STRP
1.0000 | ORAL_STRIP | Freq: Once | OPHTHALMIC | Status: AC
  Administered 2023-06-19: 1 via OPHTHALMIC
  Filled 2023-06-19: qty 1

## 2023-06-19 NOTE — Discharge Instructions (Addendum)
It was a pleasure taking care of you today.  You were evaluated in the emergency room for right eye pain and swelling and bilateral ear pain and difficulty hearing.  As discussed your exam is consistent with left ear infection in the right ear effusion, your eye is most consistent with preseptal cellulitis which is an infection surrounding the tissue around your eye.  However you are additionally being treated for conjunctivitis/pinkeye.  An antibiotic has been sent into your pharmacy in addition to antibiotic eyedrops please be sure to complete the full course of these antibiotics.  Follow-up with your primary care provider within the next 5 days to ensure your symptoms are improving.

## 2023-06-19 NOTE — ED Triage Notes (Signed)
Pt c/o bilateral otalgia x 1 wk and difficulty hearing; c/o RT eye swelling since yesterday

## 2023-06-19 NOTE — ED Provider Notes (Signed)
[place] [facility]GENCY [facility]PARTMENT AT [facility] Provider Note   CSN: [id number] Arrival date & time: 06/19/23  1732     History  Chief Complaint  Patient presents with   Otalgia    Charles Fritz is a 32 y.o. male who presents with bilateral otalgia and 1 week of difficulty hearing with right-sided periorbital swelling that started today.  Denies any injury or trauma to the face.  Denies any eye drainage.  Does not wear contacts.  No vomiting, fevers or chills or other systemic symptoms.  Denies any pain with eye movement.  He is unsure whether something is in his eye.   Otalgia      Home Medications Prior to Admission medications   Medication Sig Start Date End Date Taking? Authorizing Provider  amoxicillin-clavulanate (AUGMENTIN) 875-125 MG tablet Take 1 tablet by mouth every 12 (twelve) hours. 06/19/23  Yes Halford Decamp, PA-C  erythromycin ophthalmic ointment Place a 1/2 inch ribbon of ointment into the lower eyelid for 3 to 5 days. 06/19/23  Yes Halford Decamp, PA-C  Hyoscyamine Sulfate SL (LEVSIN/SL) 0.125 MG SUBL Place 1 tablet (0.125 mg total) under the tongue every 4 (four) hours as needed (pain). Up to 1.25 mg daily 08/29/22   Arthor Captain, PA-C  ibuprofen (ADVIL) 800 MG tablet Take 1 tablet (800 mg total) by mouth every 8 (eight) hours as needed. Patient taking differently: Take 800 mg by mouth every 8 (eight) hours as needed (for stomach pain). 08/01/22   Terrilee Files, MD  methocarbamol (ROBAXIN) 500 MG tablet Take 1 tablet (500 mg total) by mouth 2 (two) times daily. 01/20/23   Jeanelle Malling, PA  ondansetron (ZOFRAN-ODT) 4 MG disintegrating tablet Take 1 tablet (4 mg total) by mouth every 8 (eight) hours as needed. Patient not taking: Reported on 08/29/2022 06/09/22   Long, Arlyss Repress, MD  oxyCODONE (ROXICODONE) 5 MG immediate release tablet Take 1 tablet (5 mg total) by mouth every 4 (four) hours as needed for severe pain. Patient not taking:  Reported on 08/29/2022 11/26/21   Henderly, Britni A, PA-C      Allergies    Hydrocodone    Review of Systems   Review of Systems  HENT:  Positive for ear pain.     Physical Exam Updated Vital Signs BP (!) 156/108 (BP Location: Right Arm)   Pulse 88   Temp 97.6 F (36.4 C) (Oral)   Resp 18   Ht 5\' 11"  (1.803 m)   Wt 117.9 kg   SpO2 97%   BMI 36.26 kg/m  Physical Exam Vitals and nursing note reviewed.  Constitutional:      General: He is not in acute distress.    Appearance: He is well-developed.  HENT:     Head: Normocephalic and atraumatic.     Right Ear: A middle ear effusion is present.     Left Ear: Tympanic membrane is erythematous and bulging.  Eyes:     General:        Right eye: No discharge.        Left eye: No discharge.     Extraocular Movements: Extraocular movements intact.     Pupils: Pupils are equal, round, and reactive to light.     Comments: Upon reexamination there does appear to be some very mild conjunctival injection of his right eye with watering  Cardiovascular:     Rate and Rhythm: Normal rate and regular rhythm.     Heart sounds: No  murmur heard. Pulmonary:     Effort: Pulmonary effort is normal. No respiratory distress.     Breath sounds: Normal breath sounds.  Abdominal:     Palpations: Abdomen is soft.     Tenderness: There is no abdominal tenderness.  Musculoskeletal:     Cervical back: Neck supple.  Skin:    General: Skin is warm and dry.     Capillary Refill: Capillary refill takes less than 2 seconds.  Neurological:     Mental Status: He is alert.  Psychiatric:        Mood and Affect: Mood normal.     ED Results / Procedures / Treatments   Labs (all labs ordered are listed, but only abnormal results are displayed) Labs Reviewed - No data to display  EKG None  Radiology No results found.  Procedures Procedures    Medications Ordered in ED Medications  fluorescein ophthalmic strip 1 strip (1 strip Right Eye Given  by Other 06/19/23 2029)  tetracaine (PONTOCAINE) 0.5 % ophthalmic solution 2 drop (2 drops Right Eye Given by Other 06/19/23 2029)    ED Course/ Medical Decision Making/ A&P                                 Medical Decision Making  This patient presents to the ED with chief complaint(s) of bilateral otalgia.  The complaint involves an extensive differential diagnosis and also carries with it a high risk of complications and morbidity.   pertinent past medical history as listed in HPI  The differential diagnosis includes  AOM, EOM, orbital versus preseptal cellulitis, Ludwig's angina, dental abscess, sinusitis The initial plan is to  Will obtain visual acuity, IOP, fluorescein Additional history obtained: Additional history obtained from spouse Records reviewed previous admission documents  Initial Assessment:   Patient is hemodynamically stable, afebrile, nontoxic-appearing presenting with bilateral otalgia and difficulty hearing with right-sided periorbital swelling.  Exam is most consistent with left-sided AOM and right-sided middle ear effusion.  Does have some periorbital tenderness.  No pain with EOMs.  There is significant conjunctival erythema, no drainage.  Do not suspect orbital cellulitis.  No facial swelling, trismus or problems with phonation.  Overall no suspicion for dental abscess, Ludwigs angina or retropharyngeal abscess  Independent ECG interpretation:  none  Independent labs interpretation:  The following labs were independently interpreted:  none  Independent visualization and interpretation of imaging: None  Treatment and Reassessment: Visual acuity: BIL 20/32 R 20/63 L 20/50  Unable to obtain IOP's despite using 2 separate tonometer's Fluorescein without any evidence of uptake or foreign body Consultations obtained:   none  Disposition:   Patient will be discharged home with course of Augmentin x 7 days to cover for AOM and preseptal cellulitis.  As there  is some concern for potential conjunctivitis component, being given erythromycin drops as well.  Encouraged to follow-up with PCP within the next 5 days.  The patient has been appropriately medically screened and/or stabilized in the ED. I have low suspicion for any other emergent medical condition which would require further screening, evaluation or treatment in the ED or require inpatient management. At time of discharge the patient is hemodynamically stable and in no acute distress. I have discussed work-up results and diagnosis with patient and answered all questions. Patient is agreeable with discharge plan. We discussed strict return precautions for returning to the emergency department and they verbalized understanding.  Social Determinants of Health:   None  This note was dictated with voice recognition software.  Despite best efforts at proofreading, errors may have occurred which can change the documentation meaning.          Final Clinical Impression(s) / ED Diagnoses Final diagnoses:  Other acute nonsuppurative otitis media of left ear, recurrence not specified  Acute effusion of right ear  Preseptal cellulitis of right eye    Rx / DC Orders ED Discharge Orders          Ordered    amoxicillin-clavulanate (AUGMENTIN) 875-125 MG tablet  Every 12 hours        06/19/23 2108    erythromycin ophthalmic ointment        06/19/23 2108              Halford Decamp, PA-C 06/19/23 2114    Loetta Rough, MD 06/20/23 1534

## 2023-08-27 ENCOUNTER — Emergency Department (HOSPITAL_BASED_OUTPATIENT_CLINIC_OR_DEPARTMENT_OTHER): Payer: MEDICAID

## 2023-08-27 ENCOUNTER — Encounter (HOSPITAL_BASED_OUTPATIENT_CLINIC_OR_DEPARTMENT_OTHER): Payer: Self-pay | Admitting: Emergency Medicine

## 2023-08-27 ENCOUNTER — Other Ambulatory Visit: Payer: Self-pay

## 2023-08-27 ENCOUNTER — Emergency Department (HOSPITAL_BASED_OUTPATIENT_CLINIC_OR_DEPARTMENT_OTHER)
Admission: EM | Admit: 2023-08-27 | Discharge: 2023-08-27 | Disposition: A | Discharge status: Home / Self Care | Payer: MEDICAID | Attending: Emergency Medicine | Admitting: Emergency Medicine

## 2023-08-27 DIAGNOSIS — K625 Hemorrhage of anus and rectum: Secondary | ICD-10-CM | POA: Diagnosis present

## 2023-08-27 DIAGNOSIS — K922 Gastrointestinal hemorrhage, unspecified: Secondary | ICD-10-CM | POA: Diagnosis not present

## 2023-08-27 LAB — COMPREHENSIVE METABOLIC PANEL
ALT: 54 U/L — ABNORMAL HIGH (ref 0–44)
AST: 35 U/L (ref 15–41)
Albumin: 4.3 g/dL (ref 3.5–5.0)
Alkaline Phosphatase: 78 U/L (ref 38–126)
Anion gap: 9 (ref 5–15)
BUN: 20 mg/dL (ref 6–20)
CO2: 22 mmol/L (ref 22–32)
Calcium: 9.2 mg/dL (ref 8.9–10.3)
Chloride: 106 mmol/L (ref 98–111)
Creatinine, Ser: 1.11 mg/dL (ref 0.61–1.24)
GFR, Estimated: >60 mL/min (ref >60)
Glucose, Bld: 107 mg/dL — ABNORMAL HIGH (ref 70–99)
Potassium: 3.8 mmol/L (ref 3.5–5.1)
Sodium: 137 mmol/L (ref 135–145)
Total Bilirubin: 0.5 mg/dL (ref 0.0–1.2)
Total Protein: 7.7 g/dL (ref 6.5–8.1)

## 2023-08-27 LAB — CBC
HCT: 39.9 % (ref 39.0–52.0)
Hemoglobin: 13.8 g/dL (ref 13.0–17.0)
MCH: 31.4 pg (ref 26.0–34.0)
MCHC: 34.6 g/dL (ref 30.0–36.0)
MCV: 90.7 fL (ref 80.0–100.0)
Platelets: 226 10*3/uL (ref 150–400)
RBC: 4.4 MIL/uL (ref 4.22–5.81)
RDW: 11.9 % (ref 11.5–15.5)
WBC: 11.4 10*3/uL — ABNORMAL HIGH (ref 4.0–10.5)
nRBC: 0 % (ref 0.0–0.2)

## 2023-08-27 LAB — ABO/RH: ABO/RH(D): O POS

## 2023-08-27 MED ORDER — IOHEXOL 300 MG/ML  SOLN
125.0000 mL | Freq: Once | INTRAMUSCULAR | Status: DC | PRN
Start: 2023-08-27 — End: 2023-08-28

## 2023-08-27 MED ORDER — OXYCODONE HCL 5 MG PO TABS: 5.0000 mg | ORAL_TABLET | Freq: Four times a day (QID) | ORAL | 0 refills | Status: DC | PRN

## 2023-08-27 MED ORDER — MORPHINE SULFATE (PF) 4 MG/ML IV SOLN
4.0000 mg | Freq: Once | INTRAVENOUS | Status: AC
  Administered 2023-08-27: 4 mg via INTRAVENOUS
  Filled 2023-08-27: qty 1

## 2023-08-27 MED ORDER — IOHEXOL 350 MG/ML SOLN
125.0000 mL | Freq: Once | INTRAVENOUS | Status: AC | PRN
  Administered 2023-08-27: 125 mL via INTRAVENOUS

## 2023-08-27 NOTE — Discharge Instructions (Addendum)
 Please stay away from Motrin or other NSAIDs that can increase bleeding.  Follow-up with GI specialist.

## 2023-08-27 NOTE — ED Triage Notes (Signed)
 Pt POV c/o abd pain since appx 1200 today, reports bright red bloody BM today. Also reports dizziness starting appx 1200 today.  Denies known sick contacts. Denies n/v/d. Reports increase in heavy lifting this week.

## 2023-08-27 NOTE — ED Provider Notes (Signed)
 [place] [facility] Provider Note   CSN: [id number] Arrival date & time: 08/27/23  1837     History  Chief Complaint  Patient presents with   Rectal Bleeding    Charles Fritz is a 33 y.o. male.  Patient is a 33 year old man presenting for bleeding from rectum.  Patient states today at approximately 1200 he began to have right lower abdominal pain, cramping, and bright red blood from rectum.  He states he felt like he was can have a bowel movement when to go to the bathroom and ended up passing multiple clots and bright red blood.  He admits to dizziness like, lightheadedness, and continued pain.  No nausea, vomiting.  No fevers.  The history is provided by the patient. No language interpreter was used.  Rectal Bleeding Associated symptoms: abdominal pain   Associated symptoms: no fever and no vomiting        Home Medications Prior to Admission medications   Medication Sig Start Date End Date Taking? Authorizing Provider  amoxicillin-clavulanate (AUGMENTIN) 875-125 MG tablet Take 1 tablet by mouth every 12 (twelve) hours. 06/19/23   Halford Decamp, PA-C  erythromycin ophthalmic ointment Place a 1/2 inch ribbon of ointment into the lower eyelid for 3 to 5 days. 06/19/23   Halford Decamp, PA-C  Hyoscyamine Sulfate SL (LEVSIN/SL) 0.125 MG SUBL Place 1 tablet (0.125 mg total) under the tongue every 4 (four) hours as needed (pain). Up to 1.25 mg daily 08/29/22   Arthor Captain, PA-C  ibuprofen (ADVIL) 800 MG tablet Take 1 tablet (800 mg total) by mouth every 8 (eight) hours as needed. Patient taking differently: Take 800 mg by mouth every 8 (eight) hours as needed (for stomach pain). 08/01/22   Terrilee Files, MD  methocarbamol (ROBAXIN) 500 MG tablet Take 1 tablet (500 mg total) by mouth 2 (two) times daily. 01/20/23   Jeanelle Malling, PA  ondansetron (ZOFRAN-ODT) 4 MG disintegrating tablet Take 1 tablet (4 mg total) by mouth every 8 (eight)  hours as needed. Patient not taking: Reported on 08/29/2022 06/09/22   Long, Arlyss Repress, MD  oxyCODONE (ROXICODONE) 5 MG immediate release tablet Take 1 tablet (5 mg total) by mouth every 4 (four) hours as needed for severe pain. Patient not taking: Reported on 08/29/2022 11/26/21   Henderly, Britni A, PA-C      Allergies    Hydrocodone    Review of Systems   Review of Systems  Constitutional:  Negative for chills and fever.  HENT:  Negative for ear pain and sore throat.   Eyes:  Negative for pain and visual disturbance.  Respiratory:  Negative for cough and shortness of breath.   Cardiovascular:  Negative for chest pain and palpitations.  Gastrointestinal:  Positive for abdominal pain and hematochezia. Negative for vomiting.       Blood per rectum   Genitourinary:  Negative for dysuria and hematuria.  Musculoskeletal:  Negative for arthralgias and back pain.  Skin:  Negative for color change and rash.  Neurological:  Negative for seizures and syncope.  All other systems reviewed and are negative.   Physical Exam Updated Vital Signs BP 129/82   Pulse 83   Temp 97.8 F (36.6 C) (Oral)   Resp 18   Ht 5\' 11"  (1.803 m)   Wt 117.9 kg   SpO2 94%   BMI 36.26 kg/m  Physical Exam Vitals and nursing note reviewed. Exam conducted with a chaperone present.  Constitutional:  General: He is not in acute distress.    Appearance: He is well-developed.  HENT:     Head: Normocephalic and atraumatic.  Eyes:     Conjunctiva/sclera: Conjunctivae normal.  Cardiovascular:     Rate and Rhythm: Normal rate and regular rhythm.     Heart sounds: No murmur heard. Pulmonary:     Effort: Pulmonary effort is normal. No respiratory distress.     Breath sounds: Normal breath sounds.  Abdominal:     Palpations: Abdomen is soft.     Tenderness: There is abdominal tenderness in the right lower quadrant. There is no guarding or rebound.  Genitourinary:    Rectum: No mass, tenderness, anal fissure or  external hemorrhoid.  Musculoskeletal:        General: No swelling.     Cervical back: Neck supple.  Skin:    General: Skin is warm and dry.     Capillary Refill: Capillary refill takes less than 2 seconds.  Neurological:     Mental Status: He is alert.  Psychiatric:        Mood and Affect: Mood normal.     ED Results / Procedures / Treatments   Labs (all labs ordered are listed, but only abnormal results are displayed) Labs Reviewed  COMPREHENSIVE METABOLIC PANEL - Abnormal; Notable for the following components:      Result Value   Glucose, Bld 107 (*)    ALT 54 (*)    All other components within normal limits  CBC - Abnormal; Notable for the following components:   WBC 11.4 (*)    All other components within normal limits  POC OCCULT BLOOD, ED  ABO/RH    EKG None  Radiology CT ANGIO GI BLEED Result Date: 08/27/2023 CLINICAL DATA:  Right lower quadrant abdominal pain, bleeding from rectum with clots, concern for mesenteric ischemia EXAM: CTA ABDOMEN AND PELVIS WITHOUT AND WITH CONTRAST TECHNIQUE: Multidetector CT imaging of the abdomen and pelvis was performed using the standard protocol during bolus administration of intravenous contrast. Multiplanar reconstructed images and MIPs were obtained and reviewed to evaluate the vascular anatomy. RADIATION DOSE REDUCTION: This exam was performed according to the departmental dose-optimization program which includes automated exposure control, adjustment of the mA and/or kV according to patient size and/or use of iterative reconstruction technique. CONTRAST:  <See Chart> OMNIPAQUE IOHEXOL 300 MG/ML SOLN, OMNIPAQUE IOHEXOL 350 MG/ML SOLN COMPARISON:  08/01/2022 FINDINGS: VASCULAR Normal contour and caliber of the abdominal aorta. No evidence of aneurysm, dissection, or other acute aortic pathology. 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 Review  of the MIP images confirms the above findings. NON-VASCULAR Lower Chest: No acute findings. Hepatobiliary: No solid liver abnormality is seen. Hepatomegaly, maximum coronal span 22.9 cm. Hepatic steatosis. No gallstones, gallbladder wall thickening, or biliary dilatation. Pancreas: Unremarkable. No pancreatic ductal dilatation or surrounding inflammatory changes. Spleen: Normal in size without significant abnormality. Adrenals/Urinary Tract: Adrenal glands are unremarkable. Under rotated left kidney. Kidneys are otherwise normal, without renal calculi, solid lesion, or hydronephrosis. Prominent urachal remnant (series 301, image 85). Bladder is otherwise unremarkable. Stomach/Bowel: Stomach is within normal limits. Appendix appears normal. No evidence of bowel wall thickening, distention, or inflammatory changes. Lymphatic: No enlarged abdominal or pelvic lymph nodes. Reproductive: No mass or other significant abnormality. Other: Small fat containing left inguinal hernia.  No ascites. Musculoskeletal: No acute osseous findings. IMPRESSION: 1. Normal contour and caliber of the abdominal aorta.  No evidence of aneurysm, dissection, or other acute aortic pathology. Mesenteric arteries are patent. 2. No CT findings of the abdomen or pelvis to explain right lower quadrant pain. Normal appendix. 3. No intraluminal contrast extravasation or other findings to localize nidus of GI bleeding. 4. Hepatomegaly and hepatic steatosis. Electronically Signed   By: Jearld Lesch M.D.   On: 08/27/2023 21:25    Procedures Procedures    Medications Ordered in ED Medications  iohexol (OMNIPAQUE) 300 MG/ML solution 125 mL ( Intravenous Canceled Entry 08/27/23 2040)  morphine (PF) 4 MG/ML injection 4 mg (4 mg Intravenous Given 08/27/23 2010)  iohexol (OMNIPAQUE) 350 MG/ML injection 125 mL (125 mLs Intravenous Contrast Given 08/27/23 2041)    ED Course/ Medical Decision Making/ A&P                                 Medical Decision  Making Amount and/or Complexity of Data Reviewed Labs: ordered. Radiology: ordered.  Risk Prescription drug management.   70:34 PM  33 year old man presenting for bleeding from rectum and right lower quadrant abdominal pain.  Patient is alert and oriented times her, no acute distress, afebrile, stable vital signs.  Physical exam demonstrates soft abdomen with pain out of proportion to physical exam.  Hemoglobin stable.  Patient sent for CTA abdomen and pelvis to rule out mesenteric ischemia.  CTA demonstrates no acute process.  No mesenteric ischemia.  No acute GI bleed.  Rectal exam demonstrates no external hemorrhoids.  No tenderness.  No masses or anal fissures.  No gross blood.  Patient able to provide evidence of gross blood from rectum.  Hemoccult testing unnecessary at this time.  Patient is hemodynamically stable with a hemoglobin of13.8.  Patient recommended for close follow-up with GI specialist.  Recommended for prompt to return for signs of dizziness, lightheadedness, presyncope.  Patient in no distress and overall condition improved here in the ED. Detailed discussions were had with the patient regarding current findings, and need for close f/u with PCP or on call doctor. The patient has been instructed to return immediately if the symptoms worsen in any way for re-evaluation. Patient verbalized understanding and is in agreement with current care plan. All questions answered prior to discharge.         Final Clinical Impression(s) / ED Diagnoses Final diagnoses:  Gastrointestinal hemorrhage, unspecified gastrointestinal hemorrhage type    Rx / DC Orders ED Discharge Orders     None         Franne Forts, DO 08/27/23 2155

## 2023-08-28 ENCOUNTER — Other Ambulatory Visit (HOSPITAL_BASED_OUTPATIENT_CLINIC_OR_DEPARTMENT_OTHER): Payer: Self-pay

## 2023-08-28 ENCOUNTER — Telehealth (HOSPITAL_BASED_OUTPATIENT_CLINIC_OR_DEPARTMENT_OTHER): Payer: Self-pay | Admitting: Emergency Medicine

## 2023-08-28 MED ORDER — OXYCODONE HCL 5 MG PO TABS
5.0000 mg | ORAL_TABLET | Freq: Four times a day (QID) | ORAL | 0 refills | Status: AC | PRN
  Filled 2023-08-28: qty 12, 3d supply, fill #0

## 2023-08-28 NOTE — Telephone Encounter (Signed)
 Pharmacy prescription have been sent to did not have the oxycodone.  Transferred prescription to pharmacy here.

## 2023-09-01 ENCOUNTER — Other Ambulatory Visit: Payer: Self-pay

## 2023-09-01 ENCOUNTER — Emergency Department (HOSPITAL_COMMUNITY)
Admission: EM | Admit: 2023-09-01 | Discharge: 2023-09-01 | Disposition: A | Discharge status: Against Medical Advice | Payer: MEDICAID | Attending: Emergency Medicine | Admitting: Emergency Medicine

## 2023-09-01 ENCOUNTER — Encounter (HOSPITAL_COMMUNITY): Payer: Self-pay

## 2023-09-01 DIAGNOSIS — Z5321 Procedure and treatment not carried out due to patient leaving prior to being seen by health care provider: Secondary | ICD-10-CM | POA: Diagnosis not present

## 2023-09-01 DIAGNOSIS — R109 Unspecified abdominal pain: Secondary | ICD-10-CM | POA: Diagnosis present

## 2023-09-01 NOTE — ED Triage Notes (Signed)
 Pt was seen at [facility] last week and diagnosed with GI bleed and is to follow up with GI. Pt states today he tripped and fell and hit right lower abdomen and feels burning in his abdomen since. Pt states he is still having bright red blood in toilet every time he uses the bathroom

## 2023-09-01 NOTE — ED Provider Notes (Signed)
 At 3:35 I went to see the patient and was informed that patient had already eloped.    Derwood Kaplan, MD 09/01/23 [phone]

## 2023-09-01 NOTE — ED Notes (Signed)
 PT left stating he could not take it here anymore.  I tried to get him to stay to be seen but he refused.

## 2023-09-11 NOTE — Progress Notes (Deleted)
 New patient visit   Patient: Charles Fritz   DOB: 1990-07-04   33 y.o. Male  MRN: [id number] Visit Date: 09/12/2023  Today's healthcare provider: Alfredia Ferguson, PA-C   No chief complaint on file.  Subjective    Charles Fritz is a 33 y.o. male who presents today as a new patient to establish care.   Pt was seen in the ED 08/27/23 for rectal bleeding, abdominal pain and cramping.   Moderate lft, hypergly Not anemic, slight elevated wbc CT -1. Normal contour and caliber of the abdominal aorta. No evidence of aneurysm, dissection, or other acute aortic pathology. Mesenteric arteries are patent. 2. No CT findings of the abdomen or pelvis to explain right lower quadrant pain. Normal appendix. 3. No intraluminal contrast extravasation or other findings to localize nidus of GI bleeding. 4. Hepatomegaly and hepatic steatosis.   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  Marijuana occasionally   Kidney failure, acute (HCC)    Kidney stone    Malingering    Past Surgical History:  Procedure Laterality Date   FINGER SURGERY     Family Status  Relation Name Status   Mother  (Not Specified)   Father  (Not Specified)   Sister  (Not Specified)   Brother  (Not Specified)  No partnership data on file   Family History  Problem Relation Age of Onset   Healthy Mother    Healthy Father    Healthy Sister    Healthy Brother    Social History   Socioeconomic History   Marital status: Single    Spouse name: Not on file   Number of children: Not on file   Years of education: Not on file   Highest education level: Not on file  Occupational History   Not on file  Tobacco Use   Smoking status: Every Day    Current packs/day: 0.50    Average packs/day: 0.5 packs/day for 15.0 years (7.5  ttl pk-yrs)    Types: Cigarettes   Smokeless tobacco: Never  Vaping Use   Vaping status: Never Used  Substance and Sexual Activity   Alcohol use: Not Currently   Drug use: Yes    Types: Marijuana    Comment: every day   Sexual activity: Yes  Other Topics Concern   Not on file  Social History Narrative   Not on file   Social Drivers of Health   Financial Resource Strain: Not on file  Food Insecurity: Low Risk  (01/07/2023)   Received from [facility]   Hunger Vital Sign    Worried About Running Out of Food in the Last Year: Never true    Ran Out of Food in the Last Year: Never true  Transportation Needs: Not on file (01/07/2023)  Physical Activity: Not on file  Stress: Not on file  Social Connections: Unknown (10/29/2021)   Received from [facility]   Social Network    Social Network: Not on file   Outpatient Medications Prior to Visit  Medication Sig   amoxicillin-clavulanate (AUGMENTIN) 875-125 MG tablet Take 1 tablet by mouth every 12 (twelve) hours.   erythromycin ophthalmic ointment Place a 1/2 inch ribbon of ointment into the lower eyelid for 3 to 5 days.   Hyoscyamine Sulfate  SL (LEVSIN/SL) 0.125 MG SUBL Place 1 tablet (0.125 mg total) under the tongue every 4 (four) hours as needed (pain). Up to 1.25 mg daily   ibuprofen (ADVIL) 800 MG tablet Take 1 tablet (800 mg total) by mouth every 8 (eight) hours as needed. (Patient taking differently: Take 800 mg by mouth every 8 (eight) hours as needed (for stomach pain).)   methocarbamol (ROBAXIN) 500 MG tablet Take 1 tablet (500 mg total) by mouth 2 (two) times daily.   ondansetron (ZOFRAN-ODT) 4 MG disintegrating tablet Take 1 tablet (4 mg total) by mouth every 8 (eight) hours as needed. (Patient not taking: Reported on 08/29/2022)   No facility-administered medications prior to visit.   Allergies  Allergen Reactions   Hydrocodone Hives     There is no immunization history on file for this  patient.  Health Maintenance  Topic Date Due   Pneumococcal Vaccine 40-26 Years old (1 of 2 - PCV) Never done   DTaP/Tdap/Td (1 - Tdap) Never done   INFLUENZA VACCINE  Never done   COVID-19 Vaccine (1 - 2024-25 season) Never done   Hepatitis C Screening  Completed   HIV Screening  Completed   HPV VACCINES  Aged Out    Patient Care Team: Patient, No Pcp Per as PCP - General (General Practice)  Review of Systems  {Insert previous labs (optional):23779} {See past labs  Heme  Chem  Endocrine  Serology  Results Review (optional):1}   Objective    There were no vitals taken for this visit. {Insert last BP/Wt (optional):23777}{See vitals history (optional):1}   Physical Exam ***  Depression Screen     No data to display         No results found for any visits on 09/12/23.  Assessment & Plan     There are no diagnoses linked to this encounter.   No follow-ups on file.      Alfredia Ferguson, PA-C  [facility] at [facility] [phone] (phone) [phone] (fax)  [facility]

## 2023-09-12 ENCOUNTER — Ambulatory Visit: Payer: MEDICAID | Admitting: Physician Assistant

## 2023-10-09 ENCOUNTER — Ambulatory Visit: Payer: MEDICAID | Admitting: Family Medicine

## 2023-10-09 DIAGNOSIS — Z0000 Encounter for general adult medical examination without abnormal findings: Secondary | ICD-10-CM

## 2023-11-13 ENCOUNTER — Emergency Department (HOSPITAL_BASED_OUTPATIENT_CLINIC_OR_DEPARTMENT_OTHER)
Admission: EM | Admit: 2023-11-13 | Discharge: 2023-11-13 | Disposition: A | Discharge status: Home / Self Care | Payer: MEDICAID | Attending: Emergency Medicine | Admitting: Emergency Medicine

## 2023-11-13 ENCOUNTER — Other Ambulatory Visit: Payer: Self-pay

## 2023-11-13 DIAGNOSIS — K047 Periapical abscess without sinus: Secondary | ICD-10-CM

## 2023-11-13 DIAGNOSIS — F17210 Nicotine dependence, cigarettes, uncomplicated: Secondary | ICD-10-CM | POA: Diagnosis not present

## 2023-11-13 DIAGNOSIS — K0889 Other specified disorders of teeth and supporting structures: Secondary | ICD-10-CM | POA: Insufficient documentation

## 2023-11-13 MED ORDER — OXYCODONE-ACETAMINOPHEN 5-325 MG PO TABS
2.0000 | ORAL_TABLET | Freq: Once | ORAL | Status: AC
  Administered 2023-11-13: 2 via ORAL
  Filled 2023-11-13: qty 2

## 2023-11-13 MED ORDER — OXYCODONE HCL 5 MG PO TABS: 2.5000 mg | ORAL_TABLET | ORAL | 0 refills | Status: AC | PRN

## 2023-11-13 MED ORDER — AMOXICILLIN-POT CLAVULANATE 875-125 MG PO TABS: 1.0000 | ORAL_TABLET | Freq: Two times a day (BID) | ORAL | 0 refills | Status: AC

## 2023-11-13 MED ORDER — AMOXICILLIN-POT CLAVULANATE 875-125 MG PO TABS
1.0000 | ORAL_TABLET | Freq: Once | ORAL | Status: AC
  Administered 2023-11-13: 1 via ORAL
  Filled 2023-11-13: qty 1

## 2023-11-13 NOTE — ED Provider Notes (Signed)
 History of Present Illness   Patient Identification Charles Fritz is a 33 y.o. male.  Patient information was obtained from patient. History/Exam limitations: none. Patient presented to the Emergency Department by private vehicle.  Chief Complaint  Dental Pain   Patient who presents with complaint of toothache. Onset of symptoms was abrupt starting 1 months ago. Patient describes pain as aching and throbbing. Pain severity at onset was mild and now is severe. The pain does not radiate. Patient denies jaw swelling or fever >101. Pain is aggravated by movement and palpation. Pain is alleviated by nothing. The patient denies other complaints. Patient has not sought treatment by another care provider for this problem. Care prior to arrival consisted of NSAID, with no relief.   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  Marijuana occasionally   Kidney failure, acute (HCC)    Kidney stone    Malingering    Family History  Problem Relation Age of Onset   Healthy Mother    Healthy Father    Healthy Sister    Healthy Brother    Scheduled Meds:  amoxicillin -clavulanate  1 tablet Oral Once   oxyCODONE -acetaminophen   2 tablet Oral Once   Continuous Infusions: PRN Meds:  Allergies  Allergen Reactions   Hydrocodone Hives   Social History   Socioeconomic History   Marital status: Single    Spouse name: Not on file   Number of children: Not on file   Years of education: Not on file   Highest education level: Not on file  Occupational History   Not on file  Tobacco Use   Smoking status: Every Day    Current packs/day: 0.50    Average packs/day: 0.5 packs/day for 15.0 years (7.5 ttl pk-yrs)    Types: Cigarettes   Smokeless tobacco: Never  Vaping Use   Vaping status: Never Used  Substance and Sexual Activity   Alcohol use: Not Currently   Drug use: Yes    Types:  Marijuana    Comment: every day   Sexual activity: Yes  Other Topics Concern   Not on file  Social History Narrative   Not on file   Social Drivers of Health   Financial Resource Strain: Not on file  Food Insecurity: Low Risk  (01/07/2023)   Received from [facility]   Hunger Vital Sign    Worried About Running Out of Food in the Last Year: Never true    Ran Out of Food in the Last Year: Never true  Transportation Needs: Not on file (01/07/2023)  Physical Activity: Not on file  Stress: Not on file  Social Connections: Unknown (10/29/2021)   Received from [facility]   Social Network    Social Network: Not on file  Intimate Partner Violence: Not At Risk (03/13/2023)   Received from [facility]   HITS    Over the last 12 months how often did your partner physically hurt you?: Never    Over the last 12 months how often did your partner insult you or talk down to you?: Never    Over the last 12 months how often did your partner threaten you with physical harm?: Never    Over the last 12 months how often did your partner scream or curse at you?: Never   Review of Systems Pertinent items are noted in HPI.   Physical  Exam   BP (!) 140/92 (BP Location: Right Arm)   Pulse (!) 108   Temp 98.8 F (37.1 C)   Resp 18   Ht 5\' 11"  (1.803 m)   Wt 120.2 kg   SpO2 95%   BMI 36.96 kg/m  General appearance: alert and appears uncomfortable holding his face Head: Normocephalic, without obvious abnormality, atraumatic Throat: Poor dentition, lbroken R upper canine with localized gingivitis, tender and boggy gingiva without overt abscess Neck: no adenopathy and supple, symmetrical, trachea midline Skin: warm and dry   ED Course   Studies: None indicated.  Records Reviewed: Old medical records.  Treatments:  Medications  amoxicillin -clavulanate (AUGMENTIN ) 875-125 MG per tablet 1 tablet (1 tablet Oral Given 11/13/23 2252)  oxyCODONE -acetaminophen  (PERCOCET/ROXICET)  5-325 MG per tablet 2 tablet (2 tablets Oral Given 11/13/23 2252)   MDM: Patient with developing dental infection, no signs of ludwigs angina or overt dental abscess.   Consultations: none  Disposition: Home with Narcotic pain medication, atnibiotics, Referral dentist, and Advised to return for worsening or additional problems such as abdominal or chest pain PDMP reviewed during this encounter.    Tama Fails, PA-C 11/15/23 1031    Mordecai Applebaum, MD 11/20/23 [phone]

## 2023-11-13 NOTE — Discharge Instructions (Addendum)
You have been seen by your caregiver because of dental pain.  °SEEK MEDICAL ATTENTION IF: °The exam and treatment you received today has been provided on an emergency basis only. This is not a substitute for complete medical or dental care. If your problem worsens or new symptoms (problems) appear, and you are unable to arrange prompt follow-up care with your dentist, call or return to this location. °CALL YOUR DENTIST OR RETURN IMMEDIATELY IF you develop a fever, rash, difficulty breathing or swallowing, neck or facial swelling, or other potentially serious concerns. ° ° °[employer]  °[employer]  °[facility]  °[address]  °[place], [place] [address]  °Phone [phone]  °[facility] [employer] [facility] in [place], [place], exemplifies the Dental School?s vision to improve the health and quality of life of all [place]ians by creating leaders with a passion to care for the underserved and by leading the nation in community-based, service learning oral health education. °We are committed to offering comprehensive general dental services for adults, children and special needs patients in a safe, caring and professional setting. ° °Appointments: Our clinic is open Monday through Friday 8:00 a.m. until 5:00 p.m. The amount of time scheduled for an appointment depends on the patient?s specific needs. We ask that you keep your appointed time for care or provide 24-hour notice of all appointment changes. Parents or legal guardians must accompany minor children. ° °Payment for Services: Medicaid and other insurance plans are welcome. Payment for services is due when services are rendered and may be made by cash or credit card. If you have dental insurance, we will assist you with your claim submission.  ° °Emergencies: Emergency services will be provided Monday through Friday on a  walk-in basis. Please arrive early for emergency services. After hours emergency services will be provided for patients of record as required. ° °Services:  °Comprehensive General Dentistry  °Children?s Dentistry  °Oral Surgery - Extractions  °Root Canals  °Sealants and Tooth Colored Fillings  °Crowns and Bridges  °Dentures and Partial Dentures  °Implant Services  °Periodontal Services and Cleanings  °Cosmetic Tooth Whitening  °Digital Radiography  °3-D/[facility] Beam Imaging ° ° °

## 2023-11-13 NOTE — ED Triage Notes (Signed)
 Pt c/o dental pain (RT upper) and facial swelling/pain; reports tooth was broken about 1 month ago

## 2023-12-17 ENCOUNTER — Emergency Department (HOSPITAL_BASED_OUTPATIENT_CLINIC_OR_DEPARTMENT_OTHER): Payer: MEDICAID

## 2023-12-17 ENCOUNTER — Emergency Department (HOSPITAL_BASED_OUTPATIENT_CLINIC_OR_DEPARTMENT_OTHER)
Admission: EM | Admit: 2023-12-17 | Discharge: 2023-12-17 | Disposition: A | Discharge status: Home / Self Care | Payer: MEDICAID | Attending: Emergency Medicine | Admitting: Emergency Medicine

## 2023-12-17 ENCOUNTER — Encounter (HOSPITAL_BASED_OUTPATIENT_CLINIC_OR_DEPARTMENT_OTHER): Payer: Self-pay

## 2023-12-17 ENCOUNTER — Other Ambulatory Visit: Payer: Self-pay

## 2023-12-17 DIAGNOSIS — R000 Tachycardia, unspecified: Secondary | ICD-10-CM | POA: Diagnosis not present

## 2023-12-17 DIAGNOSIS — R61 Generalized hyperhidrosis: Secondary | ICD-10-CM | POA: Diagnosis not present

## 2023-12-17 DIAGNOSIS — R7401 Elevation of levels of liver transaminase levels: Secondary | ICD-10-CM | POA: Diagnosis not present

## 2023-12-17 DIAGNOSIS — R918 Other nonspecific abnormal finding of lung field: Secondary | ICD-10-CM

## 2023-12-17 DIAGNOSIS — T675XXA Heat exhaustion, unspecified, initial encounter: Secondary | ICD-10-CM | POA: Insufficient documentation

## 2023-12-17 DIAGNOSIS — T679XXA Effect of heat and light, unspecified, initial encounter: Secondary | ICD-10-CM

## 2023-12-17 DIAGNOSIS — D72829 Elevated white blood cell count, unspecified: Secondary | ICD-10-CM | POA: Insufficient documentation

## 2023-12-17 DIAGNOSIS — R519 Headache, unspecified: Secondary | ICD-10-CM | POA: Diagnosis not present

## 2023-12-17 LAB — BASIC METABOLIC PANEL WITH GFR
Anion gap: 15 (ref 5–15)
BUN: 17 mg/dL (ref 6–20)
CO2: 21 mmol/L — ABNORMAL LOW (ref 22–32)
Calcium: 9.5 mg/dL (ref 8.9–10.3)
Chloride: 104 mmol/L (ref 98–111)
Creatinine, Ser: 1.11 mg/dL (ref 0.61–1.24)
GFR, Estimated: >60 mL/min (ref >60)
Glucose, Bld: 102 mg/dL — ABNORMAL HIGH (ref 70–99)
Potassium: 3.8 mmol/L (ref 3.5–5.1)
Sodium: 140 mmol/L (ref 135–145)

## 2023-12-17 LAB — HEPATIC FUNCTION PANEL
ALT: 61 U/L — ABNORMAL HIGH (ref 0–44)
AST: 32 U/L (ref 15–41)
Albumin: 4.8 g/dL (ref 3.5–5.0)
Alkaline Phosphatase: 95 U/L (ref 38–126)
Bilirubin, Direct: 0.1 mg/dL (ref 0.0–0.2)
Indirect Bilirubin: 0.2 mg/dL — ABNORMAL LOW (ref 0.3–0.9)
Total Bilirubin: 0.3 mg/dL (ref 0.0–1.2)
Total Protein: 7.7 g/dL (ref 6.5–8.1)

## 2023-12-17 LAB — CBC
HCT: 40.2 % (ref 39.0–52.0)
Hemoglobin: 13.8 g/dL (ref 13.0–17.0)
MCH: 31.2 pg (ref 26.0–34.0)
MCHC: 34.3 g/dL (ref 30.0–36.0)
MCV: 91 fL (ref 80.0–100.0)
Platelets: 221 10*3/uL (ref 150–400)
RBC: 4.42 MIL/uL (ref 4.22–5.81)
RDW: 12.1 % (ref 11.5–15.5)
WBC: 12.2 10*3/uL — ABNORMAL HIGH (ref 4.0–10.5)
nRBC: 0 % (ref 0.0–0.2)

## 2023-12-17 LAB — CK: Total CK: 112 U/L (ref 49–397)

## 2023-12-17 LAB — TROPONIN T, HIGH SENSITIVITY: Troponin T High Sensitivity: <15 ng/L (ref <19)

## 2023-12-17 MED ORDER — KETOROLAC TROMETHAMINE 15 MG/ML IJ SOLN
15.0000 mg | Freq: Once | INTRAMUSCULAR | Status: AC
Start: 2023-12-17 — End: 2023-12-17
  Administered 2023-12-17: 15 mg via INTRAVENOUS
  Filled 2023-12-17: qty 1

## 2023-12-17 MED ORDER — LACTATED RINGERS IV BOLUS
1000.0000 mL | Freq: Once | INTRAVENOUS | Status: AC
  Administered 2023-12-17: 1000 mL via INTRAVENOUS

## 2023-12-17 MED ORDER — ACETAMINOPHEN 325 MG PO TABS
650.0000 mg | ORAL_TABLET | Freq: Once | ORAL | Status: AC
Start: 2023-12-17 — End: 2023-12-17
  Administered 2023-12-17: 650 mg via ORAL
  Filled 2023-12-17: qty 2

## 2023-12-17 MED ORDER — ONDANSETRON 4 MG PO TBDP: 4.0000 mg | ORAL_TABLET | Freq: Three times a day (TID) | ORAL | 0 refills | Status: AC | PRN

## 2023-12-17 MED ORDER — ONDANSETRON HCL 4 MG/2ML IJ SOLN
4.0000 mg | Freq: Once | INTRAMUSCULAR | Status: AC
  Administered 2023-12-17: 4 mg via INTRAVENOUS
  Filled 2023-12-17: qty 2

## 2023-12-17 MED ORDER — AZITHROMYCIN 250 MG PO TABS: 250.0000 mg | ORAL_TABLET | Freq: Every day | ORAL | 0 refills | Status: AC

## 2023-12-17 NOTE — ED Notes (Signed)
 IV Removed

## 2023-12-17 NOTE — ED Notes (Signed)
 Ice packs applied to axillas and neck

## 2023-12-17 NOTE — ED Triage Notes (Signed)
 States was working outside since 0630 this morning, started having vomiting at 1130, unable to eat/drink since breakfast. Chest pain since 1540. Lethargic, diaphoretic in triage.

## 2023-12-17 NOTE — ED Provider Notes (Signed)
 [facility] Provider Note   CSN: [id number] Arrival date & time: 12/17/23  1621     Patient presents with: Heat Exposure and Chest Pain   Charles Fritz is a 33 y.o. male.  With past medical history of AKI, kidney stone, history of substance abuse presents to emergency room with complaint of heat exhaustion.  Patient reports that he was working outside beginning at 6:30 AM and then started feeling very hot and became nauseous and started vomiting.  Due to nausea was not well tolerating oral intake.  Also notes some centralized chest pain and feeling very tired and lightheaded.  No syncopal episode.  No injury trauma or fall.    Chest Pain      Prior to Admission medications   Medication Sig Start Date End Date Taking? Authorizing Provider  amoxicillin -clavulanate (AUGMENTIN ) 875-125 MG tablet Take 1 tablet by mouth every 12 (twelve) hours. 06/19/23   Donnajean Lynwood DEL, PA-C  amoxicillin -clavulanate (AUGMENTIN ) 875-125 MG tablet Take 1 tablet by mouth every 12 (twelve) hours. 11/13/23   Harris, Abigail, PA-C  erythromycin  ophthalmic ointment Place a 1/2 inch ribbon of ointment into the lower eyelid for 3 to 5 days. 06/19/23   Donnajean Lynwood DEL, PA-C  Hyoscyamine  Sulfate SL (LEVSIN AMIEL) 0.125 MG SUBL Place 1 tablet (0.125 mg total) under the tongue every 4 (four) hours as needed (pain). Up to 1.25 mg daily 08/29/22   Harris, Abigail, PA-C  ibuprofen  (ADVIL ) 800 MG tablet Take 1 tablet (800 mg total) by mouth every 8 (eight) hours as needed. Patient taking differently: Take 800 mg by mouth every 8 (eight) hours as needed (for stomach pain). 08/01/22   Towana Ozell BROCKS, MD  methocarbamol  (ROBAXIN ) 500 MG tablet Take 1 tablet (500 mg total) by mouth 2 (two) times daily. 01/20/23   Ladora Congress, PA  ondansetron  (ZOFRAN -ODT) 4 MG disintegrating tablet Take 1 tablet (4 mg total) by mouth every 8 (eight) hours as needed. Patient not taking: Reported on  08/29/2022 06/09/22   Long, Fonda MATSU, MD  oxyCODONE  (ROXICODONE ) 5 MG immediate release tablet Take 0.5-1 tablets (2.5-5 mg total) by mouth every 4 (four) hours as needed for severe pain (pain score 7-10). 11/13/23   Arloa Chroman, PA-C    Allergies: Hydrocodone    Review of Systems  Cardiovascular:  Positive for chest pain.    Updated Vital Signs BP (!) 163/134 (BP Location: Right Arm)   Pulse (!) 104   Temp 99.9 F (37.7 C) (Rectal)   Resp (!) 22   Ht 5' 11 (1.803 m)   Wt 122.5 kg   SpO2 98%   BMI 37.66 kg/m   Physical Exam Vitals and nursing note reviewed.  Constitutional:      General: He is not in acute distress.    Appearance: He is diaphoretic. He is not toxic-appearing.  HENT:     Head: Normocephalic and atraumatic.   Eyes:     General: No scleral icterus.    Conjunctiva/sclera: Conjunctivae normal.    Cardiovascular:     Rate and Rhythm: Normal rate and regular rhythm.     Pulses: Normal pulses.     Heart sounds: Normal heart sounds.  Pulmonary:     Effort: Pulmonary effort is normal. No respiratory distress.     Breath sounds: Normal breath sounds.  Abdominal:     General: Abdomen is flat. Bowel sounds are normal.     Palpations: Abdomen is soft.  Tenderness: There is no abdominal tenderness.   Skin:    General: Skin is warm.     Findings: No lesion.   Neurological:     General: No focal deficit present.     Mental Status: He is alert and oriented to person, place, and time. Mental status is at baseline.     (all labs ordered are listed, but only abnormal results are displayed) Labs Reviewed  BASIC METABOLIC PANEL WITH GFR - Abnormal; Notable for the following components:      Result Value   CO2 21 (*)    Glucose, Bld 102 (*)    All other components within normal limits  CBC - Abnormal; Notable for the following components:   WBC 12.2 (*)    All other components within normal limits  HEPATIC FUNCTION PANEL - Abnormal; Notable for the  following components:   ALT 61 (*)    Indirect Bilirubin 0.2 (*)    All other components within normal limits  CK  TROPONIN T, HIGH SENSITIVITY    EKG: EKG Interpretation Date/Time:  Tuesday December 17 2023 16:33:02 EDT Ventricular Rate:  104 PR Interval:  159 QRS Duration:  107 QT Interval:  347 QTC Calculation: 457 R Axis:   76  Text Interpretation: Sinus tachycardia Minimal ST depression Confirmed by Ruthe Cornet [phone]) on 12/17/2023 4:37:49 PM  Radiology: [facility] Chest Portable 1 View Result Date: 12/17/2023 CLINICAL DATA:  cp EXAM: PORTABLE CHEST - 1 VIEW COMPARISON:  06/11/2023 FINDINGS: Relatively low lung volumes with patchy infiltrate or atelectasis at the left lung base. No pneumothorax. Heart size and mediastinal contours are within normal limits. No effusion. Old left clavicle fracture deformity. IMPRESSION: Low volumes with patchy left basilar infiltrate or atelectasis. Electronically Signed   By: JONETTA Faes M.D.   On: 12/17/2023 17:30     Procedures   Medications Ordered in the ED  lactated ringers  bolus 1,000 mL (1,000 mLs Intravenous New Bag/Given 12/17/23 1652)  ondansetron  (ZOFRAN ) injection 4 mg (4 mg Intravenous Given 12/17/23 1648)    Clinical Course as of 12/17/23 1821  Tue Dec 17, 2023  1818 Reassessed. Feeling better. Tolerating oral intake. Vitals stable. No fever. Feel stable for d/c. Wife coming to pick him up.  [JB]    Clinical Course User Index [JB] Navi Erber, Warren SAILOR, PA-C                                 Medical Decision Making Amount and/or Complexity of Data Reviewed Labs: ordered. Radiology: ordered.  Risk OTC drugs. Prescription drug management.   This patient presents to the ED for concern of heat exposure, this involves an extensive number of treatment options, and is a complaint that carries with it a high risk of complications and morbidity.  The differential diagnosis includes heat closure, heat stroke, rhabdomyolysis, AKI, dehydration,  electrolyte abnormality, ACS, pneumonia, pneumothorax   Lab Tests:  I personally interpreted labs.  The pertinent results include:   CBC shows mild leukocytosis. Hemoglobin is 13.8 BMP with no electrolyte abnormality.  Good kidney function.  No AKI. CK normal, hepatic function panel with mild elevation of ALT at 61 Troponin under 15   Imaging Studies ordered:  I ordered imaging studies including chest x-ray   I independently visualized and interpreted imaging which showed left basal infiltrate versus atelectasis.  He has had some cough and shortness of breath.  Unclear significance of this.  Will  trial 5 days of antibiotics and follow-up with primary care for recheck. I agree with the radiologist interpretation   Cardiac Monitoring: / EKG:  The patient was maintained on a cardiac monitor.  I personally viewed and interpreted the cardiac monitored which showed an underlying rhythm of: Sinus with minimal ST changes   Problem List / ED Course / Critical interventions / Medication management  Patient reports emergency room with complaint of heat exposure.  It has been roughly 100 F outside.  He has been exposed to sun and heat since a.m.  Hemodynamically stable and well-appearing. Mild headache.  He has no focal neurological deficits.  He does not have elevated rectal body temperature.  Troponin within normal limits thus doubt ACS.  No pneumothorax on chest x-ray but mild infiltrates.  Does have slightly elevated white count. Will treat OP abx. Discussed with attending. He originally appeared drowsy but alert and oriented with significant improvement after fluids and Zofran .  He is now tolerating oral intake.  Feeling better and requesting discharge. Rectal temp 99.57F I ordered medication including NS, zofran   Reevaluation of the patient after these medicines showed that the patient improved I have reviewed the patients home medicines and have made adjustments as needed   Plan F/u w/  PCP in 2-3d to ensure resolution of sx.  Patient was given return precautions. Patient stable for discharge at this time.  Patient educated on sx/dx and verbalized understanding of plan. Return to ER w/ new or worsening sx.       Final diagnoses:  Heat exposure, initial encounter    ED Discharge Orders     None          Shermon, Warren SAILOR, PA-C 12/17/23 1824    Ruthe Cornet, DO 12/17/23 1920

## 2023-12-17 NOTE — ED Notes (Signed)
 Pt alert and oriented X 4 at the time of discharge. RR even and unlabored. No acute distress noted. Pt verbalized understanding of discharge instructions as discussed. Pt ambulatory to lobby at time of discharge.

## 2023-12-17 NOTE — Discharge Instructions (Addendum)
 Your chest x-ray shows signs of possible pneumonia. Antibiotics for 5 days.  Make sure staying well-hydrated with primarily water and you can alternate Pedialyte and Gatorade drink.  I would recommend following up with primary care and get repeat chest x-ray in 4 to 6 weeks to recheck.  Return to ER with new or worsening symptoms.

## 2023-12-17 NOTE — ED Notes (Signed)
 Pt provided with ginger ale & peanut butter crackers per provider's order.
